# Patient Record
Sex: Female | Born: 1950 | Hispanic: Yes | Marital: Married
Health system: Southern US, Community
[De-identification: ages and names within clinical notes are randomized; demographics above are authoritative.]

## PROBLEM LIST (undated history)

## (undated) DIAGNOSIS — H409 Unspecified glaucoma: Secondary | ICD-10-CM

## (undated) HISTORY — PX: ABDOMINAL HYSTERECTOMY: SHX81

## (undated) HISTORY — PX: CHOLECYSTECTOMY: SHX55

## (undated) HISTORY — DX: Unspecified glaucoma: H40.9

---

## 2019-12-22 ENCOUNTER — Encounter: Payer: Self-pay | Admitting: Medical

## 2019-12-22 ENCOUNTER — Ambulatory Visit (INDEPENDENT_AMBULATORY_CARE_PROVIDER_SITE_OTHER): Payer: Medicare Other | Admitting: Medical

## 2019-12-22 ENCOUNTER — Ambulatory Visit (HOSPITAL_BASED_OUTPATIENT_CLINIC_OR_DEPARTMENT_OTHER)
Admission: RE | Admit: 2019-12-22 | Discharge: 2019-12-22 | Disposition: A | Discharge status: Home / Self Care | Payer: Medicare Other | Source: Ambulatory Visit | Attending: Medical | Admitting: Medical

## 2019-12-22 ENCOUNTER — Other Ambulatory Visit: Payer: Self-pay

## 2019-12-22 VITALS — BP 136/58 | HR 63 | Temp 97.7°F | Resp 18 | Ht 63.0 in | Wt 148.8 lb

## 2019-12-22 DIAGNOSIS — R748 Abnormal levels of other serum enzymes: Secondary | ICD-10-CM

## 2019-12-22 DIAGNOSIS — M5442 Lumbago with sciatica, left side: Secondary | ICD-10-CM

## 2019-12-22 DIAGNOSIS — E785 Hyperlipidemia, unspecified: Secondary | ICD-10-CM

## 2019-12-22 DIAGNOSIS — M5441 Lumbago with sciatica, right side: Secondary | ICD-10-CM | POA: Diagnosis present

## 2019-12-22 DIAGNOSIS — R1013 Epigastric pain: Secondary | ICD-10-CM

## 2019-12-22 LAB — CBC WITH DIFFERENTIAL/PLATELET
Basophils Absolute: 0 10*3/uL (ref 0.0–0.1)
Basophils Relative: 0.6 % (ref 0.0–3.0)
Eosinophils Absolute: 0.1 10*3/uL (ref 0.0–0.7)
Eosinophils Relative: 2.6 % (ref 0.0–5.0)
HCT: 39.5 % (ref 36.0–46.0)
Hemoglobin: 13.4 g/dL (ref 12.0–15.0)
Lymphocytes Relative: 45.6 % (ref 12.0–46.0)
Lymphs Abs: 1.9 10*3/uL (ref 0.7–4.0)
MCHC: 33.9 g/dL (ref 30.0–36.0)
MCV: 95 fl (ref 78.0–100.0)
Monocytes Absolute: 0.3 10*3/uL (ref 0.1–1.0)
Monocytes Relative: 7.2 % (ref 3.0–12.0)
Neutro Abs: 1.8 10*3/uL (ref 1.4–7.7)
Neutrophils Relative %: 44 % (ref 43.0–77.0)
Platelets: 230 10*3/uL (ref 150.0–400.0)
RBC: 4.16 Mil/uL (ref 3.87–5.11)
RDW: 13.5 % (ref 11.5–15.5)
WBC: 4.1 10*3/uL (ref 4.0–10.5)

## 2019-12-22 LAB — COMPREHENSIVE METABOLIC PANEL
ALT: 11 U/L (ref 0–35)
AST: 17 U/L (ref 0–37)
Albumin: 4.2 g/dL (ref 3.5–5.2)
Alkaline Phosphatase: 109 U/L (ref 39–117)
BUN: 15 mg/dL (ref 6–23)
CO2: 28 mEq/L (ref 19–32)
Calcium: 9.4 mg/dL (ref 8.4–10.5)
Chloride: 107 mEq/L (ref 96–112)
Creatinine, Ser: 0.64 mg/dL (ref 0.40–1.20)
GFR: 91.47 mL/min (ref >60.00)
Glucose, Bld: 91 mg/dL (ref 70–99)
Potassium: 5 mEq/L (ref 3.5–5.1)
Sodium: 142 mEq/L (ref 135–145)
Total Bilirubin: 0.5 mg/dL (ref 0.2–1.2)
Total Protein: 6.8 g/dL (ref 6.0–8.3)

## 2019-12-22 LAB — LIPID PANEL
Cholesterol: 205 mg/dL — ABNORMAL HIGH (ref 0–200)
HDL: 81.4 mg/dL (ref >39.00)
LDL Cholesterol: 104 mg/dL — ABNORMAL HIGH (ref 0–99)
NonHDL: 123.66
Total CHOL/HDL Ratio: 3
Triglycerides: 97 mg/dL (ref 0.0–149.0)
VLDL: 19.4 mg/dL (ref 0.0–40.0)

## 2019-12-22 LAB — LIPASE: Lipase: 30 U/L (ref 11.0–59.0)

## 2019-12-22 IMAGING — DX DG LUMBAR SPINE 2-3V
3 series · 3 of 3 positions shown · non-contrast
Comparison: None

CLINICAL DATA: Low back pain and bilateral sciatica.

EXAM:
LUMBAR SPINE - 2-3 VIEW

[l-spine ap]
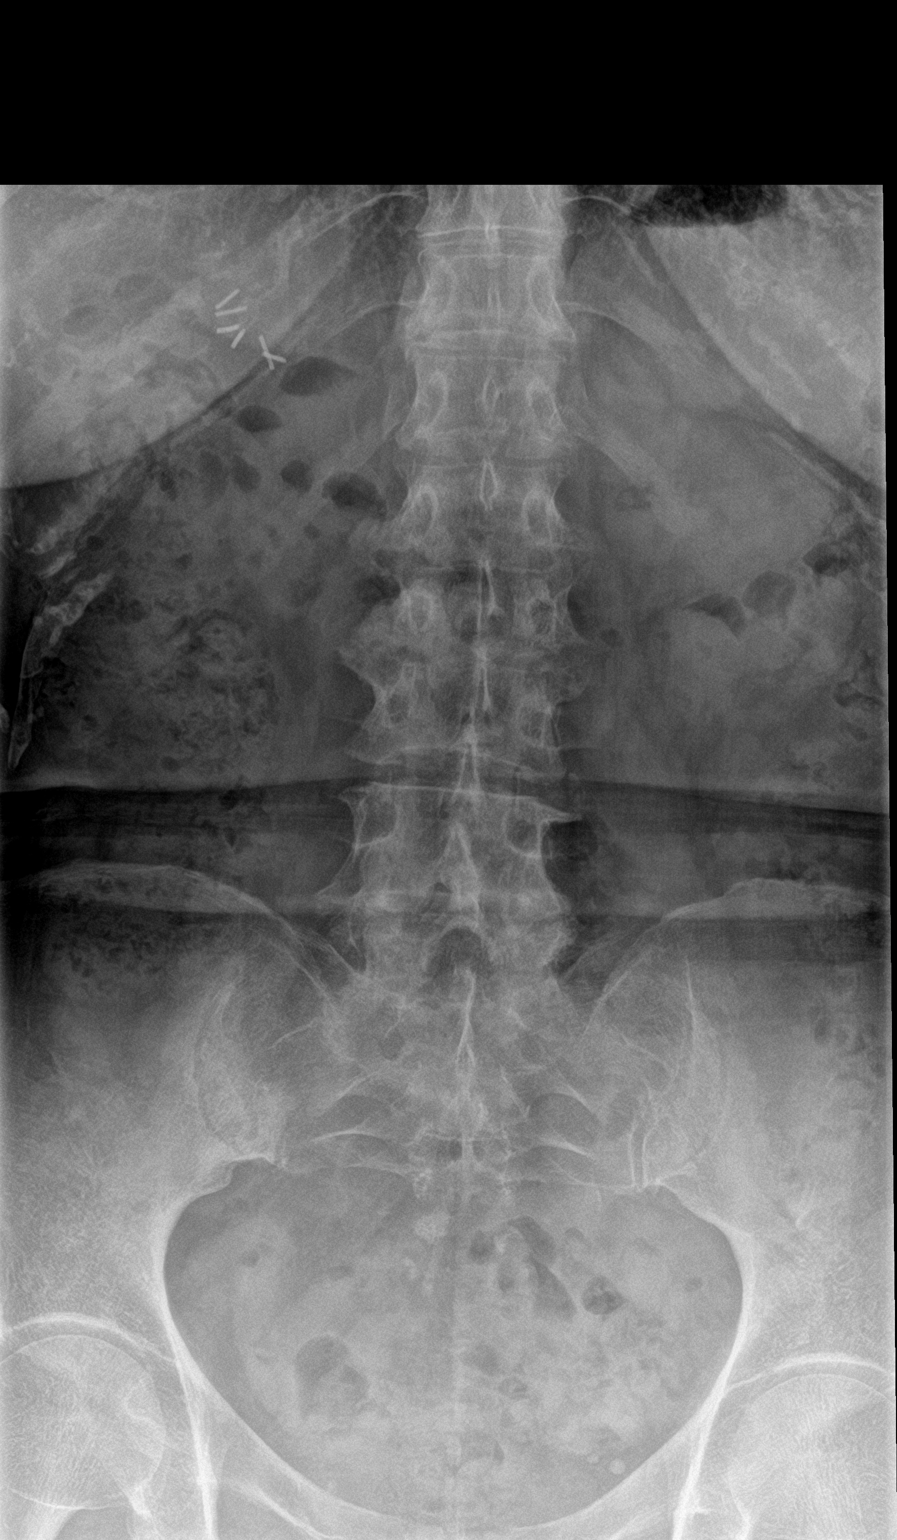

[l-spine lat]
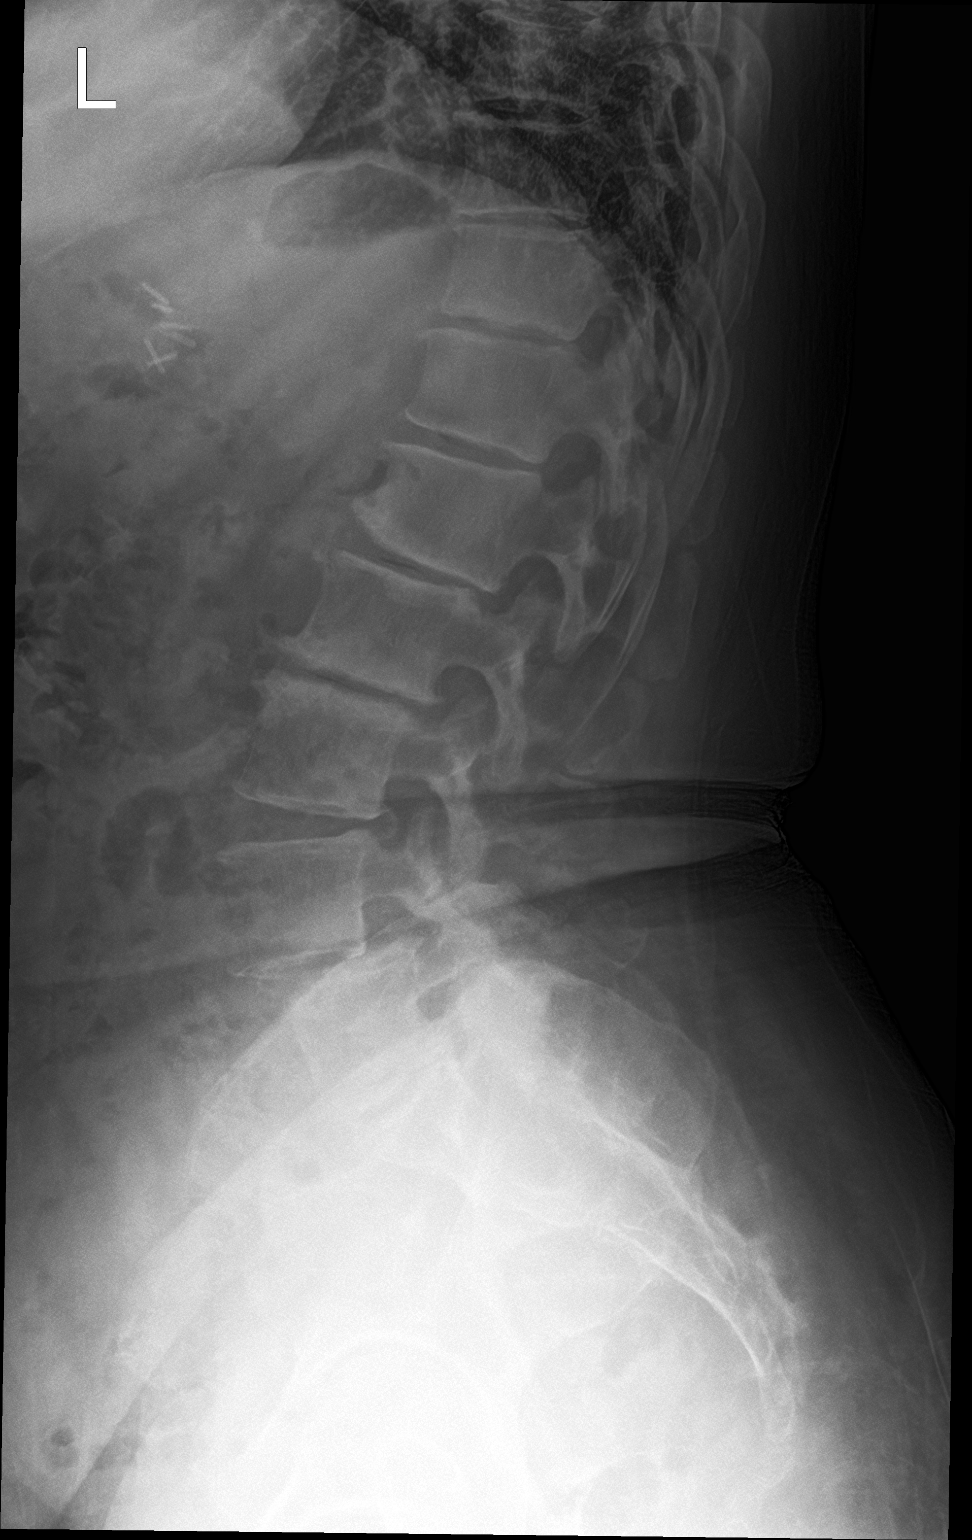

[l-spine spot]
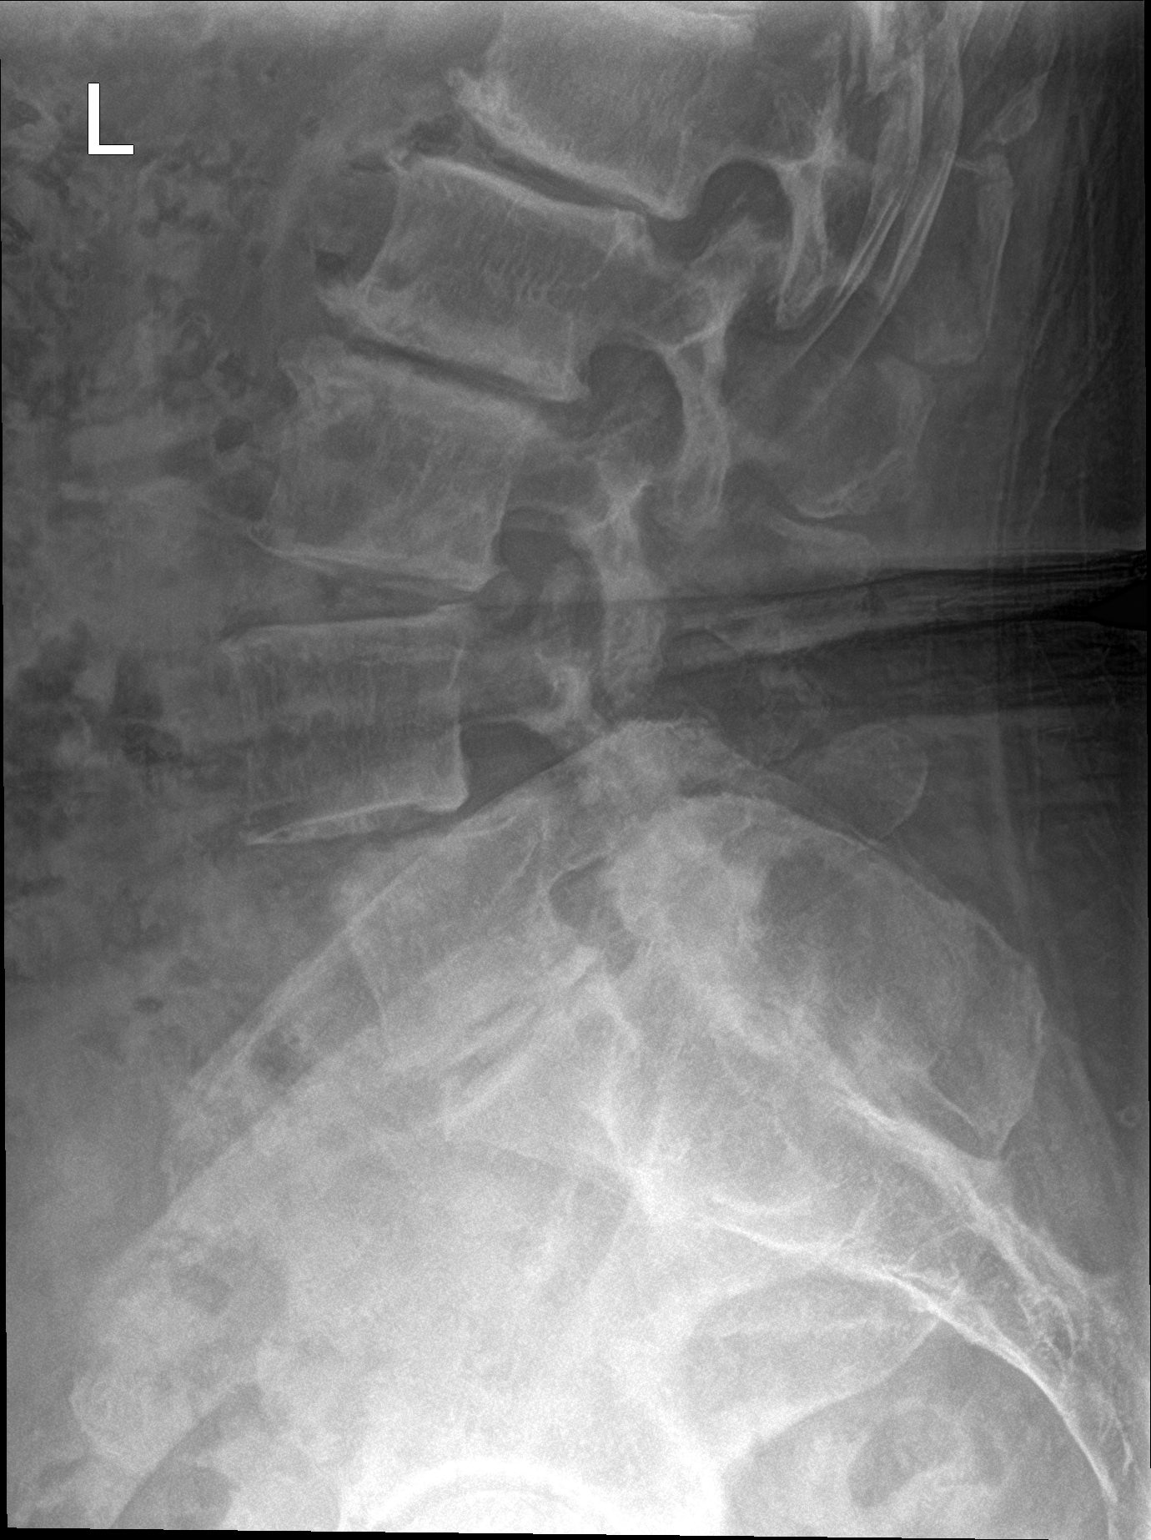

[3 of 3 positions shown; findings below may reference images not displayed]

FINDINGS: There is a first degree anterolisthesis of L4 on L5. Mild anterior
wedging of the L1 vertebral body. The remaining lumbar vertebral
body heights are well preserved. Multi level disc space narrowing
and endplate spurring is noted throughout the lumbar spine. This is
most advanced at L2-3 and L5-S1. Cholecystectomy clips noted in the
right upper quadrant of the abdomen.
IMPRESSION: 1. Multi level degenerative disc disease with first degree
anterolisthesis of L4 on L5.
2. Mild chronic appearing anterior wedging of the L1 vertebral body.

## 2019-12-22 NOTE — Progress Notes (Addendum)
Subjective:    Patient ID: Sara [name], female    DOB: Feb 24, 1951, 69 y.o.   MRN: [id number]  HPI  Pt in for for first time.  Pt is from [place] originally. Pt moved from [place] 3 months ago. Temporarily living in [place] and will move to house in [place].   Pt married.  She does not exercise much. She states eating healthy. Drinks one cup of coffee a day. No sodas. Non smoker.  Pt states no meds used in past.  She states only mild lipid elevation in the past. Slight liver enzyme elevation in the past. Pt drinks alcohol very rarely and minimal.   Mentioned about one week of epigastric pain after easter. Felt full easily without eating and pain after eating. Maybe reflux. Had sour taste in mouth. Pain can occur maybe one time a week and very transient and mild.  Pt had EGD around time had gallbladder removed.  Pt last colonoscopy was 2015. Had done out of states. She states 2 in her life and told normal studies.   Only 2 lb wt loss as she is trying to loose weight.   Recent lost 3 family members from Covid. She is said about this and could not visit her family. Pt is depressed. Pt mood has been improving slowly.       Review of Systems  Constitutional: Negative for chills, fatigue and fever.  HENT: Negative for congestion and ear discharge.   Respiratory: Negative for cough, choking, chest tightness, shortness of breath and wheezing.   Cardiovascular: Negative for chest pain and palpitations.  Gastrointestinal: Negative for abdominal pain, constipation, nausea and vomiting.  Musculoskeletal: Positive for back pain.       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 Pain low back for one month. Some piain that radiates to both legs. No meds used.  Skin: Negative for rash.  Neurological: Negative for dizziness, numbness and headaches.  Hematological: Negative for adenopathy. Does not  bruise/bleed easily.  Psychiatric/Behavioral: Negative for behavioral problems, confusion and sleep disturbance. The patient is not nervous/anxious.    No past medical history on file.   Social History   Socioeconomic History  . Marital status: Married    Spouse name: Not on file  . Number of children: Not on file  . Years of education: Not on file  . Highest education level: Not on file  Occupational History  . Not on file  Tobacco Use  . Smoking status: Not on file  Substance and Sexual Activity  . Alcohol use: Not on file  . Drug use: Not on file  . Sexual activity: Not on file  Other Topics Concern  . Not on file  Social History Narrative  . Not on file   Social Determinants of Health   Financial Resource Strain:   . Difficulty of Paying Living Expenses:   Food Insecurity:   . Worried About Programme researcher, broadcasting/film/video in the Last Year:   . Barista in the Last Year:   Transportation Needs:   . Freight forwarder (Medical):   Marland Kitchen Lack of Transportation (Non-Medical):   Physical Activity:   . Days of Exercise per Week:   . Minutes of Exercise per Session:   Stress:   . Feeling of Stress :   Social Connections:   . Frequency of Communication with Friends and Family:   . Frequency of Social  Gatherings with Friends and Family:   . Attends Religious Services:   . Active Member of Clubs or Organizations:   . Attends Banker Meetings:   Marland Kitchen Marital Status:   Intimate Partner Violence:   . Fear of Current or Ex-Partner:   . Emotionally Abused:   Marland Kitchen Physically Abused:   . Sexually Abused:      No family history on file.  Not on File  No current outpatient medications on file prior to visit.   No current facility-administered medications on file prior to visit.    BP (!) 136/58 (BP Location: Left Arm, Patient Position: Sitting, Cuff Size: Normal)   Pulse 63   Temp 97.7 F (36.5 C) (Temporal)   Resp 18   Ht 5\' 3"  (1.6 m)   Wt 148 lb 12.8 oz  (67.5 kg)   SpO2 98%   BMI 26.36 kg/m       Objective:   Physical Exam  General Mental Status- Alert. General Appearance- Not in acute distress.   Skin General: Color- Normal Color. Moisture- Normal Moisture.  Neck Carotid Arteries- Normal color. Moisture- Normal Moisture. No carotid bruits. No JVD.  Chest and Lung Exam Auscultation: Breath Sounds:-Normal.  Cardiovascular Auscultation:Rythm- Regular. Murmurs & Other Heart Sounds:Auscultation of the heart reveals- No Murmurs.  Abdomen Inspection:-Inspeection Normal. Palpation/Percussion:Note:No mass. Palpation and Percussion of the abdomen reveal- Non Tender, Non Distended + BS, no rebound or guarding.  Neurologic Cranial Nerve exam:- CN III-XII intact(No nystagmus), symmetric smile. Strength:- 5/5 equal and symmetric strength both upper and lower extremities.  Back-  Bilateral sciatic pain on palpation. Pain rotating thorax.   Lower ext- l5-s1 sensation intact. Strength symmetric 5/5.    Assessment & Plan:  For high cholesterol and elevated liver enzyme history will get lipid panel and cmp today.  For hx of epigastric pain will get cbc, lipase and h pylori breath test. Eat healthy and use pepcid if you need.  For back pain, will get xray today. Can use low dose ibuprofen 200 mg every 8 hours. Titrate up if needed. Rx advisement.   Might refer to sports medicine after xray review.  Please sign release of information form.  Follow up 1 month or as needed  Esperanza Richters, PA-C   Time spent with patient today was 30  minutes which consisted of  discussing diagnosis, work up ,treatment and documentation.  Ask to sign release form so we can get old records.

## 2019-12-22 NOTE — Patient Instructions (Signed)
For high cholesterol and elevated liver enzyme history will get lipid panel and cmp today.  For hx of epigastric pain will get cbc, lipase and h pylori breath test. Eat healthy and use pepcid if you need.  For back pain, will get xray today. Can use low dose ibuprofen 200 mg every 8 hours. Titrate up if needed. Rx advisement.   Might refer to sports medicine after xray review.  Please sign release of information form.  Follow up 1 month or as needed

## 2019-12-25 LAB — H. PYLORI BREATH TEST: H. pylori Breath Test: NOT DETECTED

## 2019-12-29 ENCOUNTER — Telehealth: Payer: Self-pay | Admitting: Medical

## 2019-12-29 DIAGNOSIS — M5441 Lumbago with sciatica, right side: Secondary | ICD-10-CM

## 2019-12-29 NOTE — Telephone Encounter (Signed)
Referral to ortho placed.

## 2020-02-12 ENCOUNTER — Other Ambulatory Visit: Payer: Self-pay | Admitting: Obstetrics and Gynecology

## 2020-02-12 DIAGNOSIS — Z1231 Encounter for screening mammogram for malignant neoplasm of breast: Secondary | ICD-10-CM

## 2020-02-13 ENCOUNTER — Ambulatory Visit (INDEPENDENT_AMBULATORY_CARE_PROVIDER_SITE_OTHER): Payer: Medicare Other | Admitting: Medical

## 2020-02-13 ENCOUNTER — Other Ambulatory Visit: Payer: Self-pay

## 2020-02-13 VITALS — BP 110/60 | HR 63 | Temp 98.5°F | Resp 16 | Ht 63.0 in | Wt 146.0 lb

## 2020-02-13 DIAGNOSIS — E785 Hyperlipidemia, unspecified: Secondary | ICD-10-CM | POA: Diagnosis not present

## 2020-02-13 NOTE — Progress Notes (Signed)
Subjective:    Patient ID: Sara [name], female    DOB: 1951/06/12, 69 y.o.   MRN: [id number]  HPI  Pt in for follow up.  Pt has mild elevated cholesterol. Pt has negative family history for stroke or MI. Including siblings. Pt never was smoker. Pt has started fish oil. Pt wants to avoid taking medications prescription.  Pt states her stomach is better but not completely. Pt last labs came back negative. Pt h pylori came back negative. Pt lipase was normal. No weight loss. Now pain is much less frequent and less intense. Notes sometimes with eating.     Review of Systems  Constitutional: Negative for chills, fatigue and fever.  Respiratory: Negative for cough, chest tightness, shortness of breath and wheezing.   Cardiovascular: Negative for chest pain and palpitations.  Gastrointestinal: Negative for abdominal pain, diarrhea, nausea and vomiting.  Genitourinary: Negative for difficulty urinating, dysuria, flank pain and frequency.  Musculoskeletal: Negative for back pain.  Skin: Negative for rash.  Neurological: Negative for dizziness, seizures, weakness, numbness and headaches.  Hematological: Negative for adenopathy. Does not bruise/bleed easily.  Psychiatric/Behavioral: Positive for dysphoric mood. Negative for behavioral problems, confusion, sleep disturbance and suicidal ideas. The patient is not nervous/anxious.    No past medical history on file.   Social History   Socioeconomic History  . Marital status: Married    Spouse name: Not on file  . Number of children: Not on file  . Years of education: Not on file  . Highest education level: Not on file  Occupational History  . Not on file  Tobacco Use  . Smoking status: Never Smoker  . Smokeless tobacco: Never Used  Substance and Sexual Activity  . Alcohol use: Yes    Comment: rare  . Drug use: Never  . Sexual activity: Not on file  Other Topics Concern  . Not on file  Social History Narrative  . Not  on file   Social Determinants of Health   Financial Resource Strain:   . Difficulty of Paying Living Expenses:   Food Insecurity:   . Worried About Programme researcher, broadcasting/film/video in the Last Year:   . Barista in the Last Year:   Transportation Needs:   . Freight forwarder (Medical):   Marland Kitchen Lack of Transportation (Non-Medical):   Physical Activity:   . Days of Exercise per Week:   . Minutes of Exercise per Session:   Stress:   . Feeling of Stress :   Social Connections:   . Frequency of Communication with Friends and Family:   . Frequency of Social Gatherings with Friends and Family:   . Attends Religious Services:   . Active Member of Clubs or Organizations:   . Attends Banker Meetings:   Marland Kitchen Marital Status:   Intimate Partner Violence:   . Fear of Current or Ex-Partner:   . Emotionally Abused:   Marland Kitchen Physically Abused:   . Sexually Abused:     Past Surgical History:  Procedure Laterality Date  . ABDOMINAL HYSTERECTOMY    . CHOLECYSTECTOMY      No family history on file.  Not on File  No current outpatient medications on file prior to visit.   No current facility-administered medications on file prior to visit.    BP 110/60 (BP Location: Left Arm, Patient Position: Sitting, Cuff Size: Normal)   Pulse 63   Temp 98.5 F (36.9 C) (Oral)   Resp 16  Ht 5\' 3"  (1.6 m)   Wt 146 lb (66.2 kg)   SpO2 97%   BMI 25.86 kg/m       Objective:   Physical Exam  General- No acute distress. Pleasant patient. Neck- Full range of motion, no jvd Lungs- Clear, even and unlabored. Heart- regular rate and rhythm. Neurologic- CNII- XII grossly intact.       Assessment & Plan:  For high cholesterol, continue with low cholesterol diet and fish oil. Will put in future cmp and lipid panel to do end of September.  For intermittent mild transient  upset stomach can use famotadine on occasion. Concentrate on healthy diet. If pain worsens then can expand work up and  refer to GI.  For depression that is improving want to offer medication such as effexor or other if you feel not getting over family deaths or if mood worsens. Let us know if you feel you need.  Follow up after future lab review or as needed  Time spent with patient today was 30  minutes which consisted of chart review, discussing diagnosis, work up, treatment and documentation.  Esperanza Richters, PA-C

## 2020-02-13 NOTE — Patient Instructions (Addendum)
For high cholesterol, continue with low cholesterol diet and fish oil. Will put in future cmp and lipid panel to do end of September.  For intermittent mild transient  upset stomach can use famotadine/pepcid over the counter  on occasion. Concentrate on healthy diet. If pain worsens then can expand work up and refer to GI.  For depression that is improving want to offer medication such as effexor or other if you feel not getting over family deaths or if mood worsens. Let us know if you feel you need.  Follow up after future lab review or as needed

## 2020-02-21 ENCOUNTER — Other Ambulatory Visit: Payer: Self-pay

## 2020-02-21 ENCOUNTER — Other Ambulatory Visit (INDEPENDENT_AMBULATORY_CARE_PROVIDER_SITE_OTHER): Payer: Medicare Other

## 2020-02-21 DIAGNOSIS — E785 Hyperlipidemia, unspecified: Secondary | ICD-10-CM

## 2020-02-21 LAB — COMPREHENSIVE METABOLIC PANEL
AG Ratio: 1.5 (calc) (ref 1.0–2.5)
ALT: 12 U/L (ref 6–29)
AST: 17 U/L (ref 10–35)
Albumin: 4 g/dL (ref 3.6–5.1)
Alkaline phosphatase (APISO): 105 U/L (ref 37–153)
BUN: 10 mg/dL (ref 7–25)
CO2: 29 mmol/L (ref 20–32)
Calcium: 9.4 mg/dL (ref 8.6–10.4)
Chloride: 108 mmol/L (ref 98–110)
Creat: 0.71 mg/dL (ref 0.60–0.93)
Globulin: 2.7 g/dL (calc) (ref 1.9–3.7)
Glucose, Bld: 93 mg/dL (ref 65–99)
Potassium: 5.1 mmol/L (ref 3.5–5.3)
Sodium: 142 mmol/L (ref 135–146)
Total Bilirubin: 0.4 mg/dL (ref 0.2–1.2)
Total Protein: 6.7 g/dL (ref 6.1–8.1)

## 2020-02-21 LAB — LIPID PANEL
Cholesterol: 190 mg/dL (ref <200)
HDL: 85 mg/dL (ref >=50)
LDL Cholesterol (Calc): 84 mg/dL (calc)
Non-HDL Cholesterol (Calc): 105 mg/dL (calc) (ref <130)
Total CHOL/HDL Ratio: 2.2 (calc) (ref <5.0)
Triglycerides: 112 mg/dL (ref <150)

## 2020-02-21 NOTE — Addendum Note (Signed)
Addended by: Mervin Kung A on: 02/21/2020 08:45 AM   Modules accepted: Orders

## 2020-02-27 ENCOUNTER — Ambulatory Visit: Payer: Medicare Other

## 2020-03-07 ENCOUNTER — Other Ambulatory Visit: Payer: Medicare Other

## 2020-11-13 ENCOUNTER — Encounter: Payer: Medicare Other | Admitting: Medical

## 2020-11-13 ENCOUNTER — Other Ambulatory Visit: Payer: Self-pay

## 2020-11-15 ENCOUNTER — Emergency Department (HOSPITAL_COMMUNITY): Payer: Medicare Other

## 2020-11-15 ENCOUNTER — Encounter (HOSPITAL_COMMUNITY): Payer: Self-pay

## 2020-11-15 ENCOUNTER — Other Ambulatory Visit: Payer: Self-pay

## 2020-11-15 ENCOUNTER — Emergency Department (HOSPITAL_COMMUNITY)
Admission: EM | Admit: 2020-11-15 | Discharge: 2020-11-15 | Disposition: A | Discharge status: Home / Self Care | Payer: Medicare Other | Attending: Emergency Medicine | Admitting: Emergency Medicine

## 2020-11-15 DIAGNOSIS — R030 Elevated blood-pressure reading, without diagnosis of hypertension: Secondary | ICD-10-CM | POA: Insufficient documentation

## 2020-11-15 DIAGNOSIS — R519 Headache, unspecified: Secondary | ICD-10-CM | POA: Insufficient documentation

## 2020-11-15 LAB — COMPREHENSIVE METABOLIC PANEL
ALT: 16 U/L (ref 0–44)
AST: 21 U/L (ref 15–41)
Albumin: 3.9 g/dL (ref 3.5–5.0)
Alkaline Phosphatase: 115 U/L (ref 38–126)
Anion gap: 7 (ref 5–15)
BUN: 10 mg/dL (ref 8–23)
CO2: 25 mmol/L (ref 22–32)
Calcium: 9.2 mg/dL (ref 8.9–10.3)
Chloride: 108 mmol/L (ref 98–111)
Creatinine, Ser: 0.64 mg/dL (ref 0.44–1.00)
GFR, Estimated: >60 mL/min (ref >60)
Glucose, Bld: 103 mg/dL — ABNORMAL HIGH (ref 70–99)
Potassium: 4.1 mmol/L (ref 3.5–5.1)
Sodium: 140 mmol/L (ref 135–145)
Total Bilirubin: 0.7 mg/dL (ref 0.3–1.2)
Total Protein: 7.5 g/dL (ref 6.5–8.1)

## 2020-11-15 LAB — CBC WITH DIFFERENTIAL/PLATELET
Abs Immature Granulocytes: 0.01 10*3/uL (ref 0.00–0.07)
Basophils Absolute: 0 10*3/uL (ref 0.0–0.1)
Basophils Relative: 0 %
Eosinophils Absolute: 0.1 10*3/uL (ref 0.0–0.5)
Eosinophils Relative: 2 %
HCT: 39 % (ref 36.0–46.0)
Hemoglobin: 13.1 g/dL (ref 12.0–15.0)
Immature Granulocytes: 0 %
Lymphocytes Relative: 51 %
Lymphs Abs: 2.5 10*3/uL (ref 0.7–4.0)
MCH: 32 pg (ref 26.0–34.0)
MCHC: 33.6 g/dL (ref 30.0–36.0)
MCV: 95.4 fL (ref 80.0–100.0)
Monocytes Absolute: 0.3 10*3/uL (ref 0.1–1.0)
Monocytes Relative: 6 %
Neutro Abs: 2.1 10*3/uL (ref 1.7–7.7)
Neutrophils Relative %: 41 %
Platelets: 223 10*3/uL (ref 150–400)
RBC: 4.09 MIL/uL (ref 3.87–5.11)
RDW: 12.8 % (ref 11.5–15.5)
WBC: 5 10*3/uL (ref 4.0–10.5)
nRBC: 0 % (ref 0.0–0.2)

## 2020-11-15 LAB — URINALYSIS, ROUTINE W REFLEX MICROSCOPIC
Bilirubin Urine: NEGATIVE
Glucose, UA: NEGATIVE mg/dL
Hgb urine dipstick: NEGATIVE
Ketones, ur: NEGATIVE mg/dL
Leukocytes,Ua: NEGATIVE
Nitrite: NEGATIVE
Protein, ur: NEGATIVE mg/dL
Specific Gravity, Urine: 1.008 (ref 1.005–1.030)
pH: 8 (ref 5.0–8.0)

## 2020-11-15 IMAGING — MR MR HEAD WO/W CM
9 of 15 series · 20 of 48 positions shown · IV contrast (gadavist)
Comparison: No pertinent prior exam.

CLINICAL DATA: Headache

EXAM:
MRI HEAD WITHOUT AND WITH CONTRAST
MRA HEAD WITHOUT CONTRAST
TECHNIQUE: Multiplanar, multi-echo pulse sequences of the brain and surrounding
structures were acquired without and with intravenous contrast.
Angiographic images of the Circle of Willis were acquired using MRA
technique without intravenous contrast.
CONTRAST:  7mL GADAVIST GADOBUTROL 1 MMOL/ML IV SOLN

[Series 2: DWI · axial · 3.0mm · 0.94mm/px · z∈[-98,+42]mm · 6 of 99 slices shown (1 of 2)]
[im 1/99]
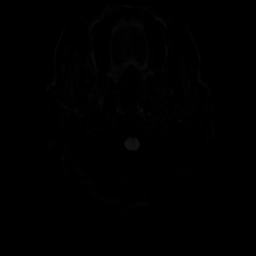
[im 20/99]
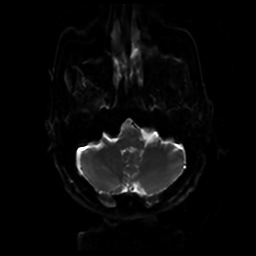
[im 40/99]
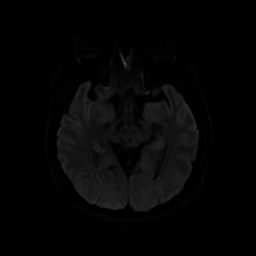
[im 59/99]
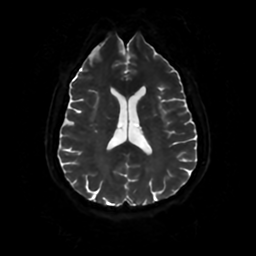
[im 79/99]
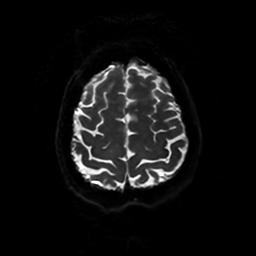
[im 99/99]
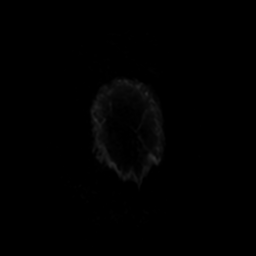

[Series 4: DWI · coronal · 4.0mm · 0.94mm/px · 4 of 74 slices shown (2 of 2)]
[im 1/74]
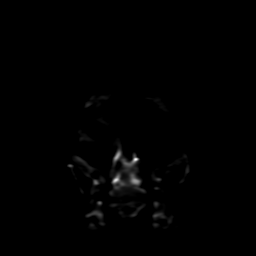
[im 25/74]
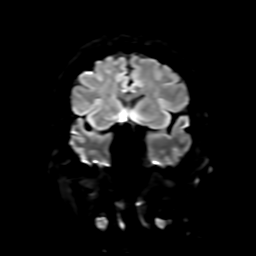
[im 49/74]
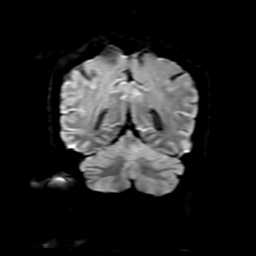
[im 74/74]
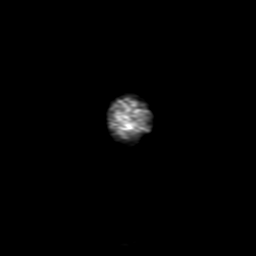

[Series 5: FLAIR · sagittal · 5.0mm · 0.23mm/px · 1 of 25 slices shown (1 of 3)]
[im 1/25]
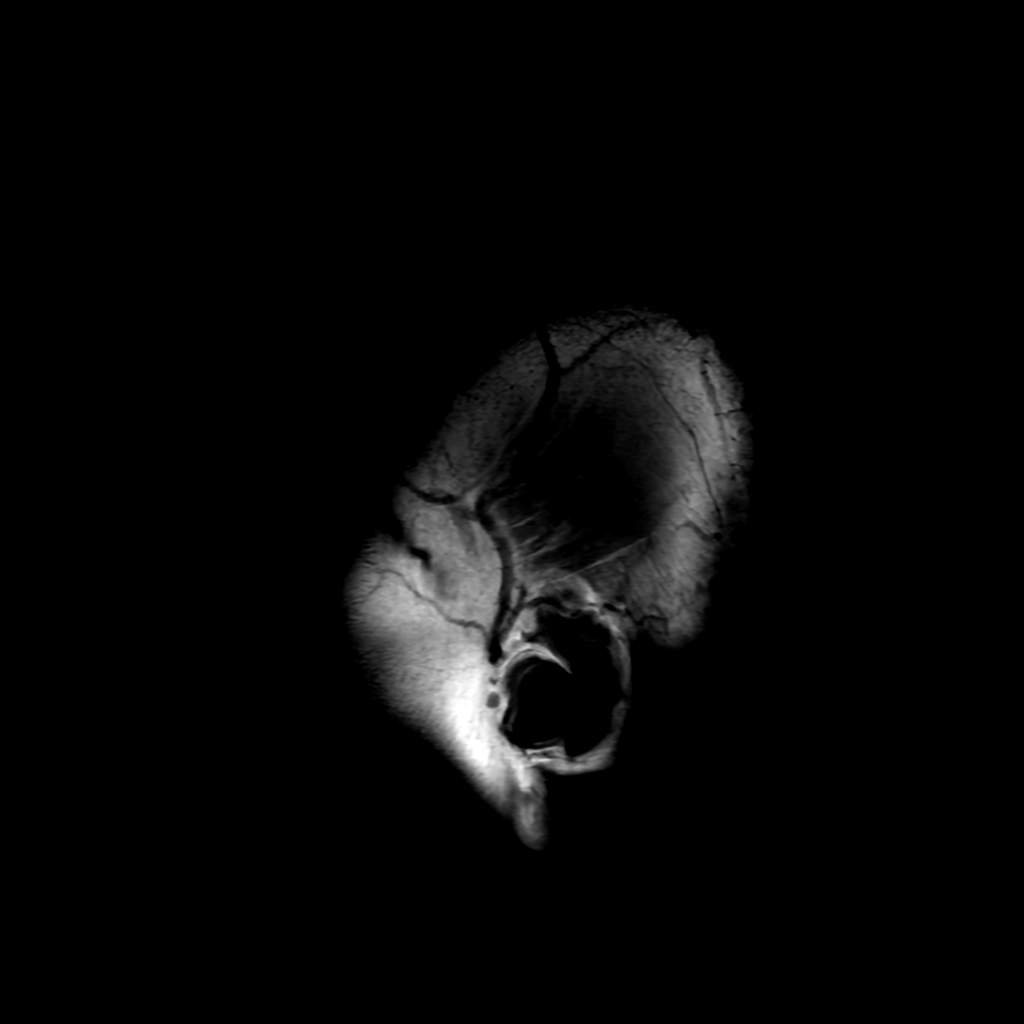

[Series 6: T2 · axial · 5.0mm · 0.23mm/px · 1 of 26 slices shown]
[im 1/26]
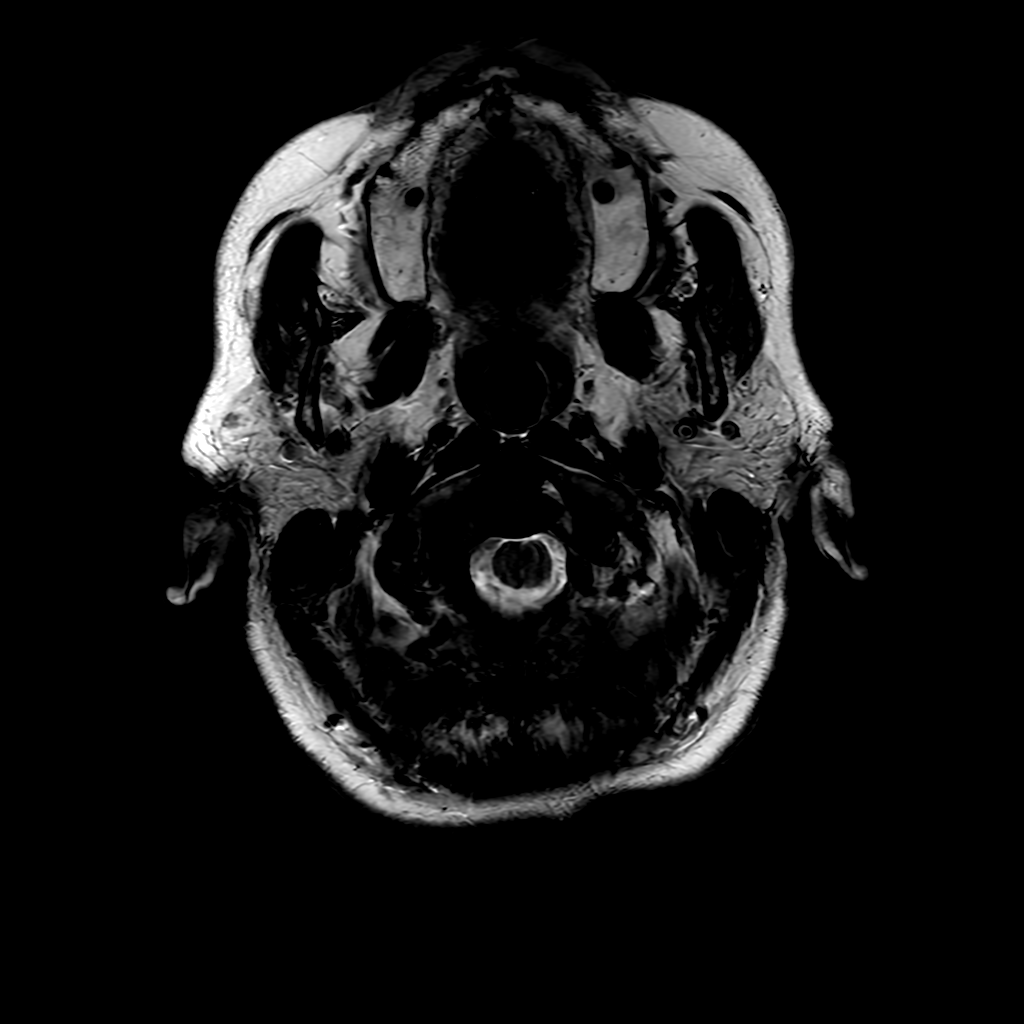

[Series 7: FLAIR · axial · 3.0mm · 0.45mm/px · 1 of 24 slices shown (2 of 3)]
[im 1/24]
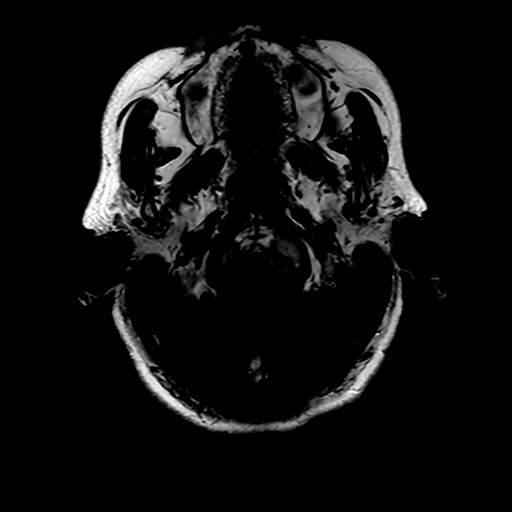

[Series 10: FLAIR · axial · 3.0mm · 0.45mm/px · 1 of 24 slices shown (3 of 3)]
[im 1/24]
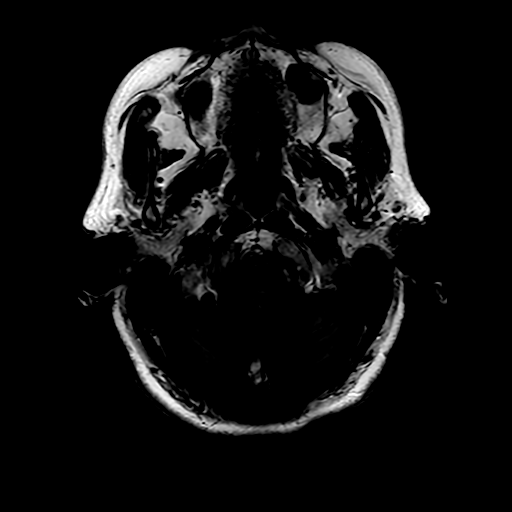

[Series 11: T2 post-contrast · coronal · 5.0mm · 0.20mm/px · 1 of 29 slices shown]
[im 1/29]
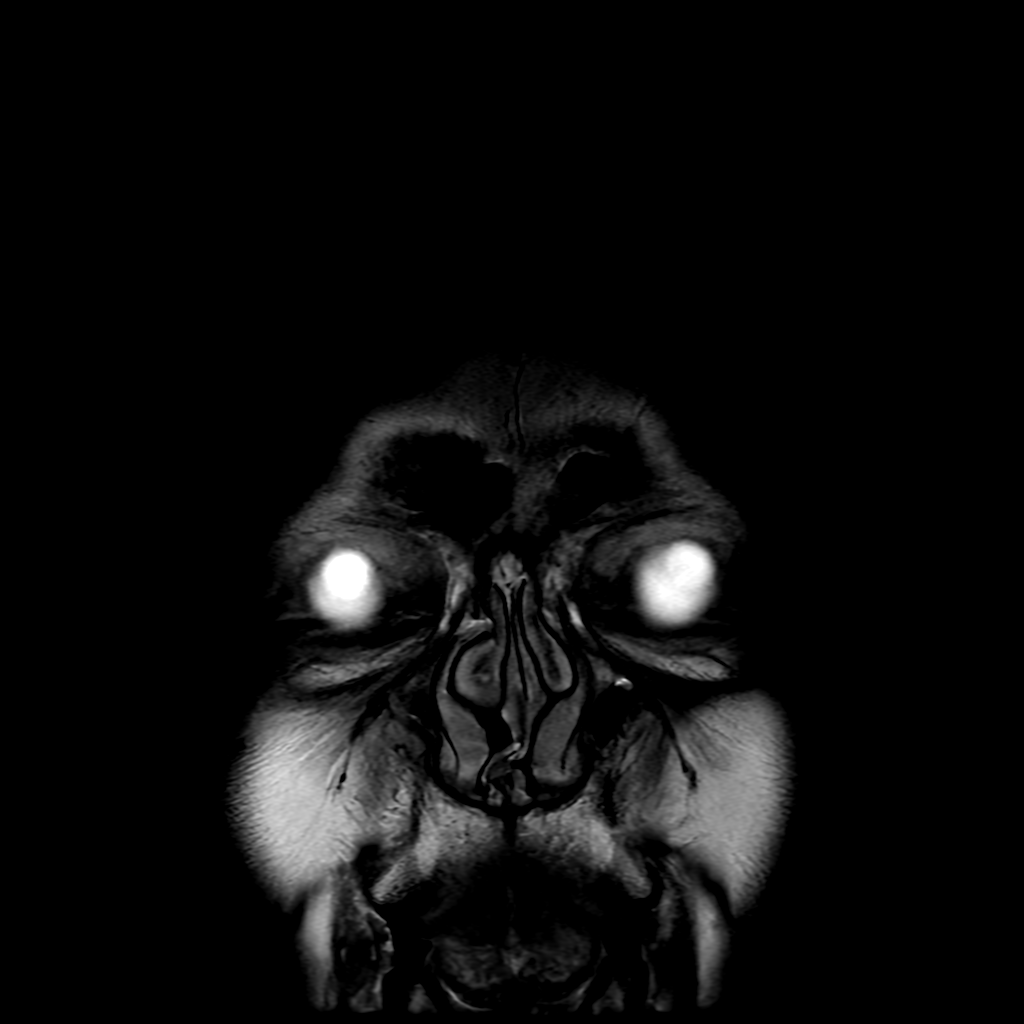

[Series 250: ADC · axial · 3.0mm · 0.94mm/px · z∈[-98,+42]mm · 3 of 50 slices shown (1 of 2)]
[im 1/50]
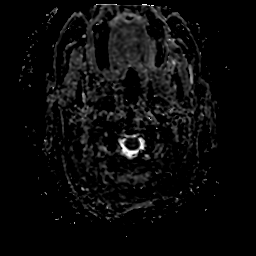
[im 25/50]
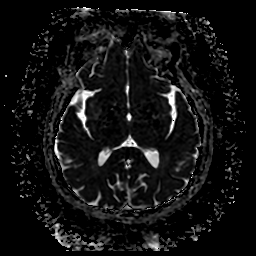
[im 50/50]
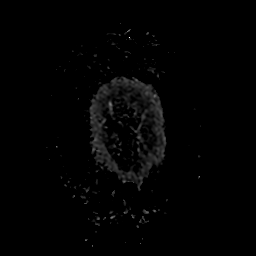

[Series 450: ADC · coronal · 4.0mm · 0.94mm/px · 2 of 37 slices shown (2 of 2)]
[im 1/37]
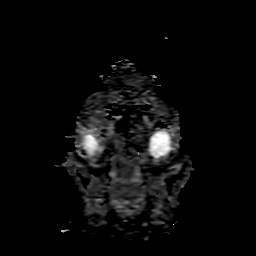
[im 37/37]
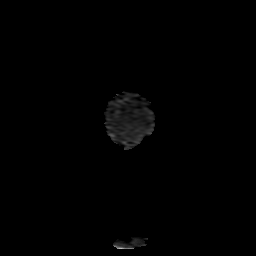

[20 of 48 positions shown; findings below may reference images not displayed]

FINDINGS: MRI HEAD FINDINGS

Brain: No acute infarct, mass effect or extra-axial collection.
Three scattered foci of chronic microhemorrhage. Normal white matter
signal, parenchymal volume and CSF spaces. The midline structures
are normal. There is no abnormal contrast enhancement.

Vascular: Major flow voids are preserved.

Skull and upper cervical spine: Normal calvarium and skull base.
Visualized upper cervical spine and soft tissues are normal.

Sinuses/Orbits:No paranasal sinus fluid levels or advanced mucosal
thickening. No mastoid or middle ear effusion. Normal orbits.

MRA HEAD FINDINGS

POSTERIOR CIRCULATION:

--Vertebral arteries: Normal

--Inferior cerebellar arteries: Normal.

--Basilar artery: Normal.

--Superior cerebellar arteries: Normal.

--Posterior cerebral arteries: Normal.

ANTERIOR CIRCULATION:

--Intracranial internal carotid arteries: Normal.

--Anterior cerebral arteries (ACA): Normal.

--Middle cerebral arteries (MCA): Normal.

ANATOMIC VARIANTS: None
IMPRESSION: 1. No acute intracranial abnormality.
2. Normal intracranial MRA.
3. Hyperdense area described on earlier head CT corresponds to the
normal right vertebral artery.

## 2020-11-15 IMAGING — CT CT HEAD W/O CM
3 series · 14 of 47 positions shown, 16 images · non-contrast
Comparison: None.

CLINICAL DATA: New or worsening headache in a 69-year-old female.

EXAM:
CT HEAD WITHOUT CONTRAST
TECHNIQUE: Contiguous axial images were obtained from the base of the skull
through the vertex without intravenous contrast.

[Series 3: head 5.0 h30s · axial · 0.41mm/px · z∈[+361,+501]mm · 8 of 34 slices shown, 10 images]
[im 3/34  brain]
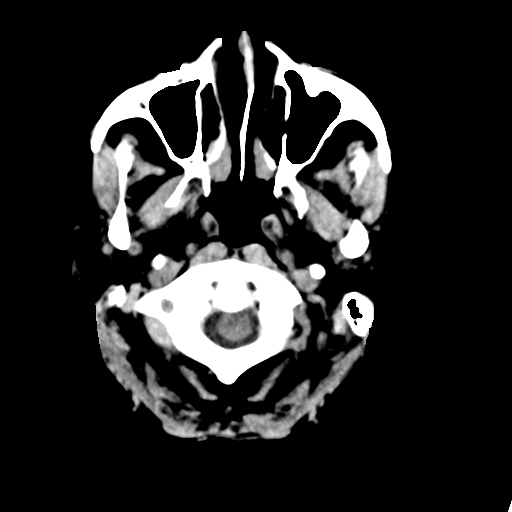
[im 3/34  bone]
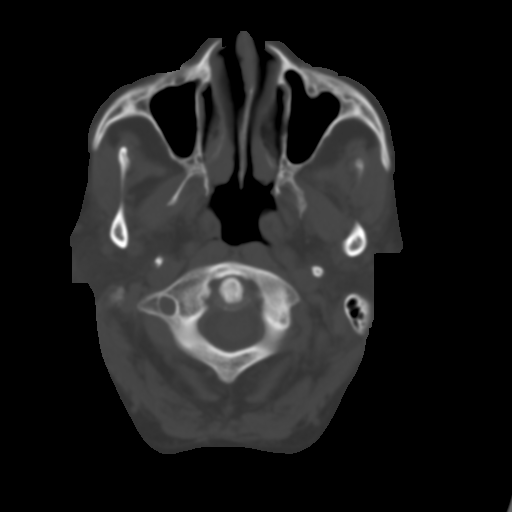
[im 7/34  brain]
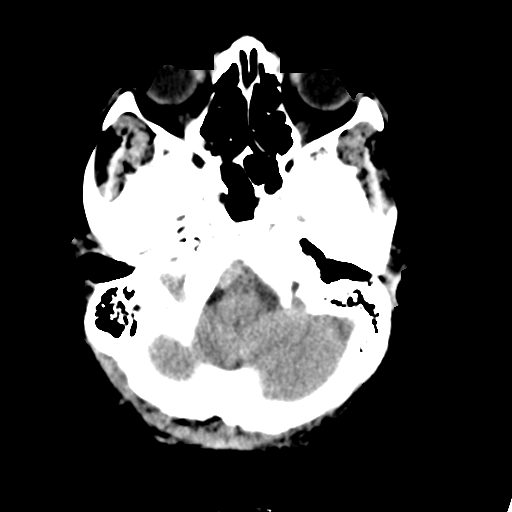
[im 11/34  brain]
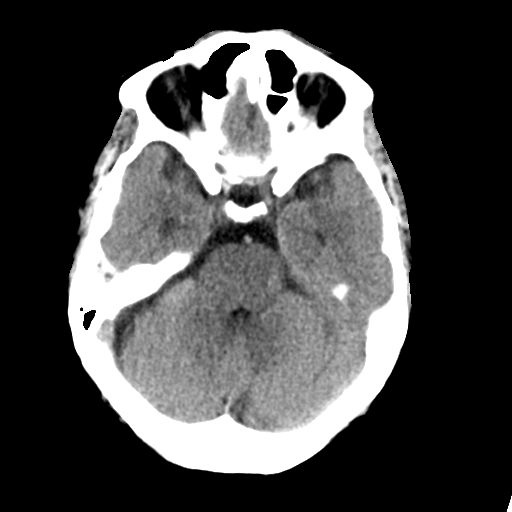
[im 15/34  brain]
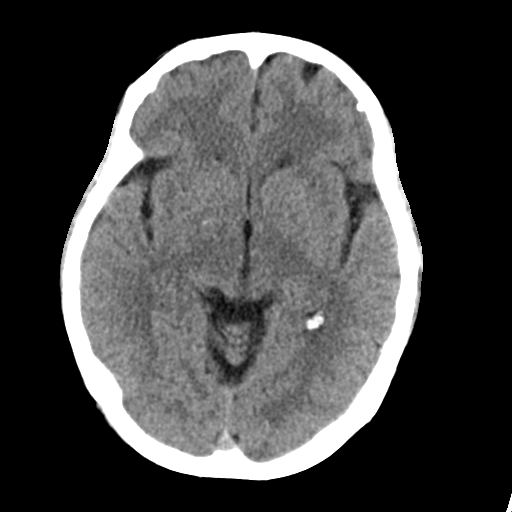
[im 19/34  brain]
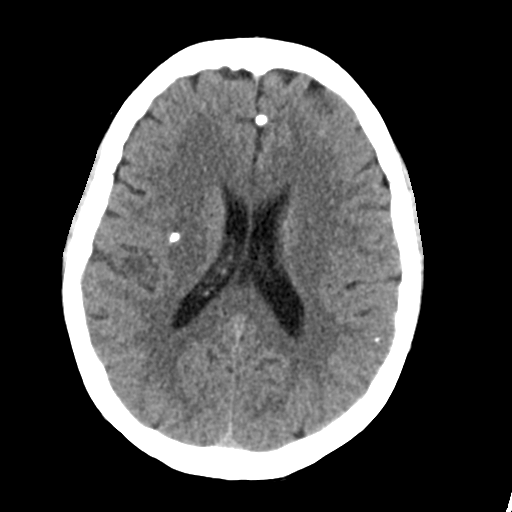
[im 19/34  bone]
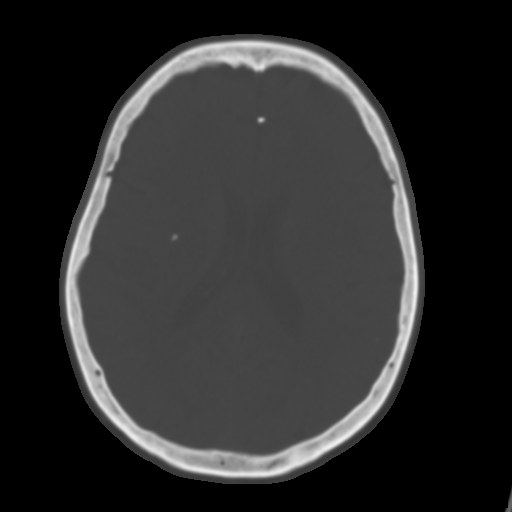
[im 23/34  brain]
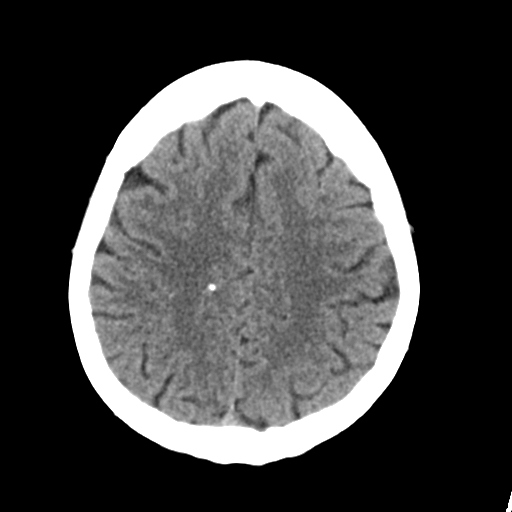
[im 27/34  brain]
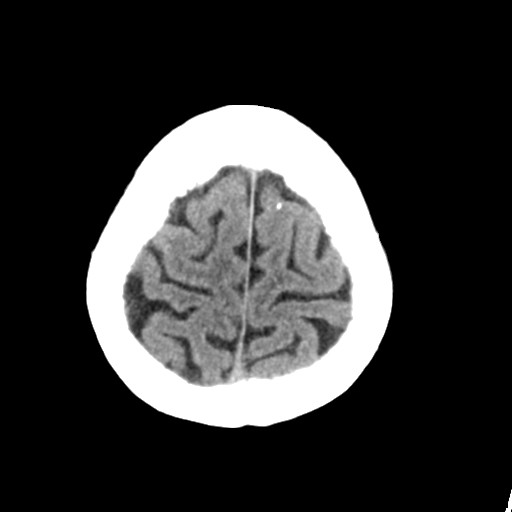
[im 31/34  brain]
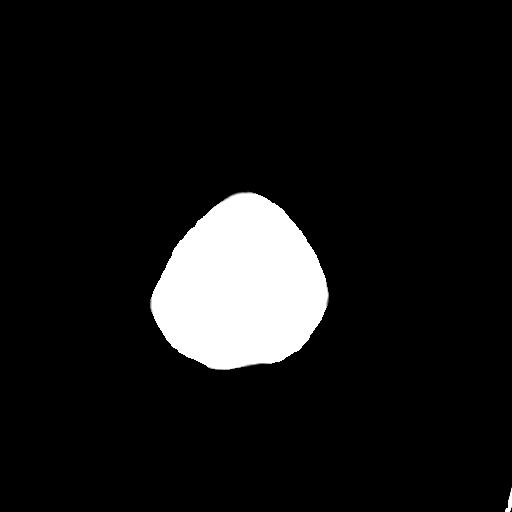

[Series 5: head 3.0 mpr cor · coronal · 0.33mm/px · 3 of 67 slices shown]
[im 23/67  brain]
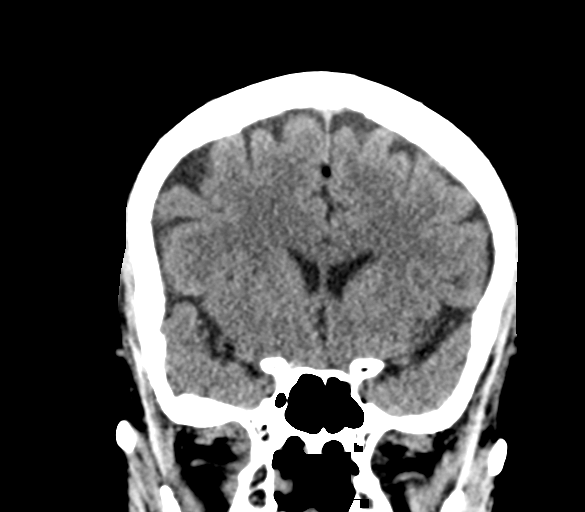
[im 30/67  brain]
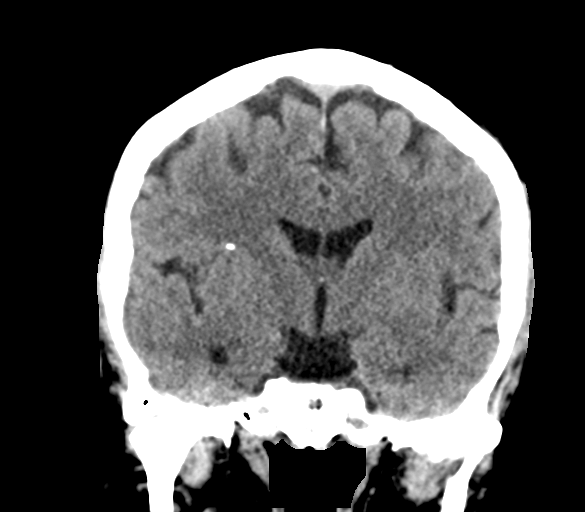
[im 37/67  brain]
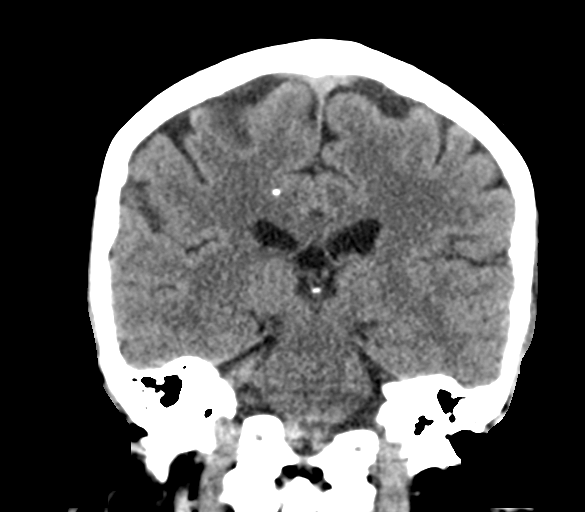

[Series 6: head 3.0 mpr sag · sagittal · 0.33mm/px · 3 of 67 slices shown]
[im 23/67  brain]
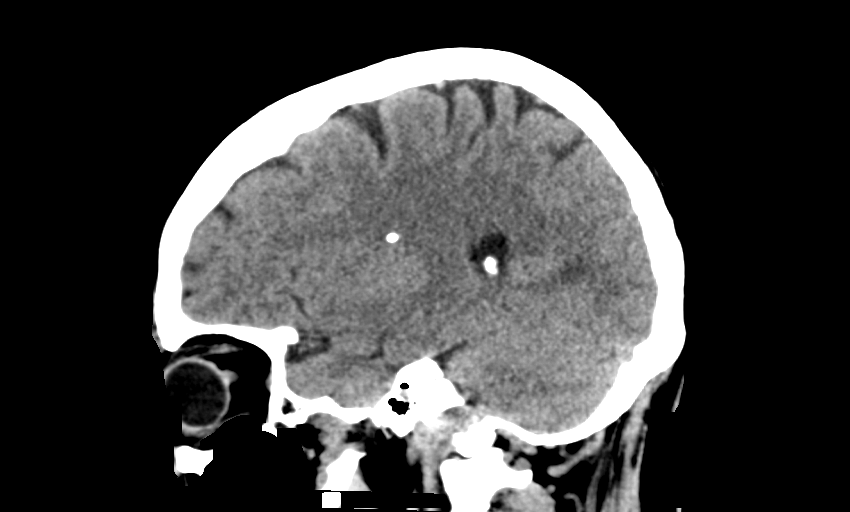
[im 34/67  brain]
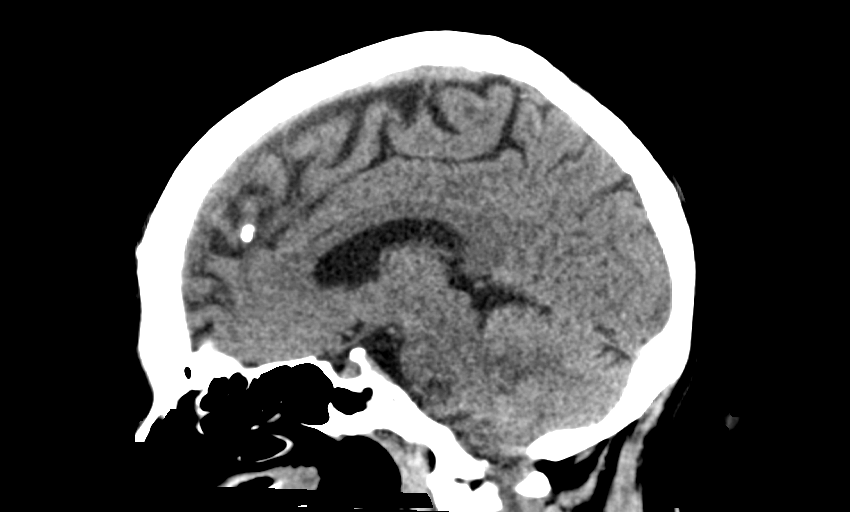
[im 45/67  brain]
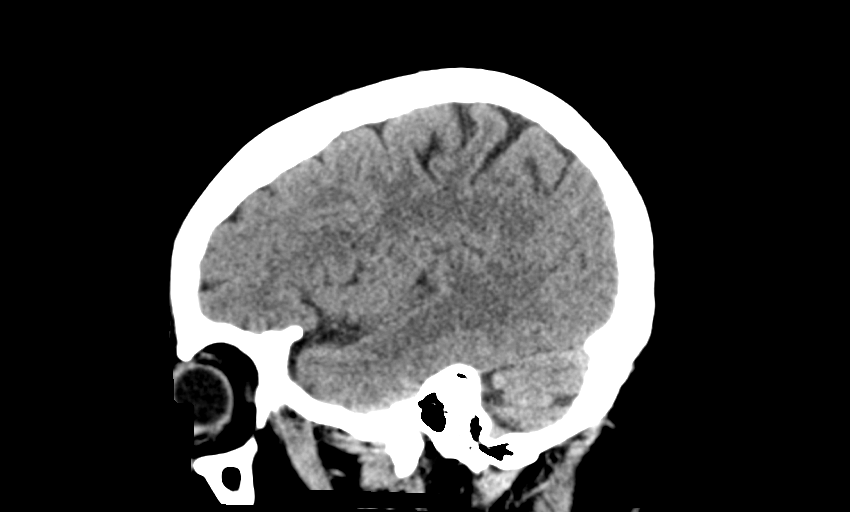

[14 of 47 positions shown; findings below may reference images not displayed]

FINDINGS: Brain: No evidence of acute infarction, hemorrhage, hydrocephalus,
extra-axial collection or mass lesion/mass effect. Multiple
bilateral cerebral calcifications. No signs of parenchymal edema by
CT.

At the foramen magnum there is a 1.1 x 0.4 cm area of increased
density which is focal and elliptical or elongated. (Image [DATE])

Vascular: No hyperdense vessel or unexpected calcification. Area at
the foramen magnum [REDACTED] be dilated RIGHT vertebral artery but is
unclear.

Skull: Normal. Negative for fracture or focal lesion.

Sinuses/Orbits: Visualized paranasal sinuses and orbits are
unremarkable.

Other: None
IMPRESSION: 1. No acute intracranial abnormality.
2. Area at the foramen magnum there is a focal and elliptical or
elongated area of increased density which may represent a small
meningioma or aneurysmal dilation of the vertebral artery. MRI of
the brain with and without contrast and MRA of the head is suggested
for further evaluation.
3. Multiple bilateral cerebral calcifications. Likely related to
prior neurocysticercosis.

## 2020-11-15 IMAGING — MR MR MRA HEAD W/O CM
1 series · 19 of 48 positions shown · IV contrast (gadavist)
Comparison: No pertinent prior exam.

CLINICAL DATA: Headache

EXAM:
MRI HEAD WITHOUT AND WITH CONTRAST
MRA HEAD WITHOUT CONTRAST
TECHNIQUE: Multiplanar, multi-echo pulse sequences of the brain and surrounding
structures were acquired without and with intravenous contrast.
Angiographic images of the Circle of Willis were acquired using MRA
technique without intravenous contrast.
CONTRAST:  7mL GADAVIST GADOBUTROL 1 MMOL/ML IV SOLN

[Series 3: ax (id) · axial · 1.0mm · 0.43mm/px · z∈[-86,-7]mm · 19 of 176 slices shown]
[im 1/176]
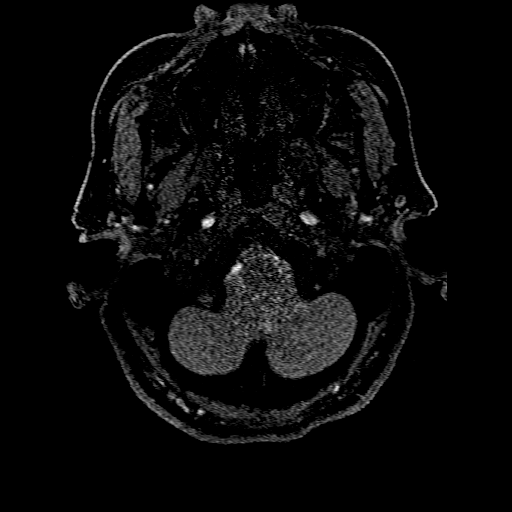
[im 4/176]
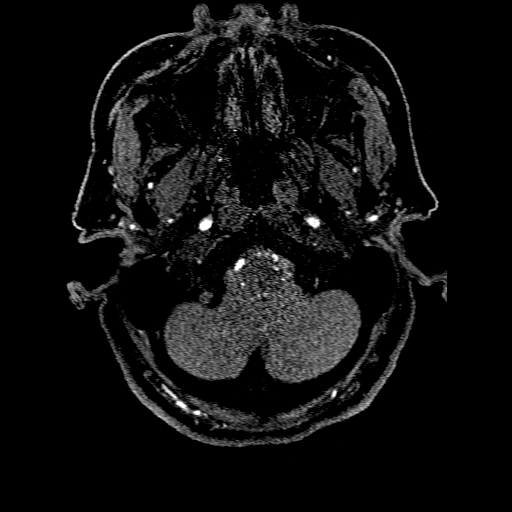
[im 8/176]
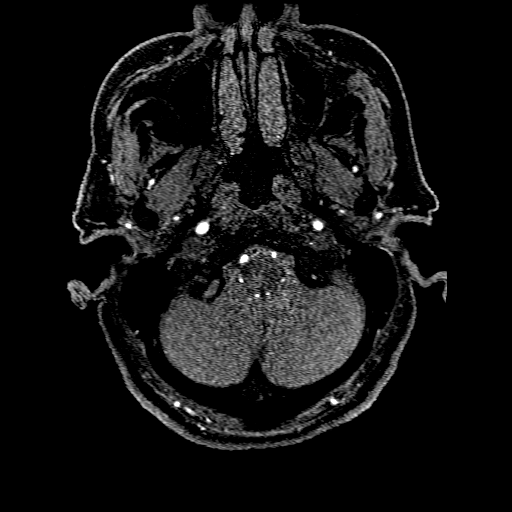
[im 12/176]
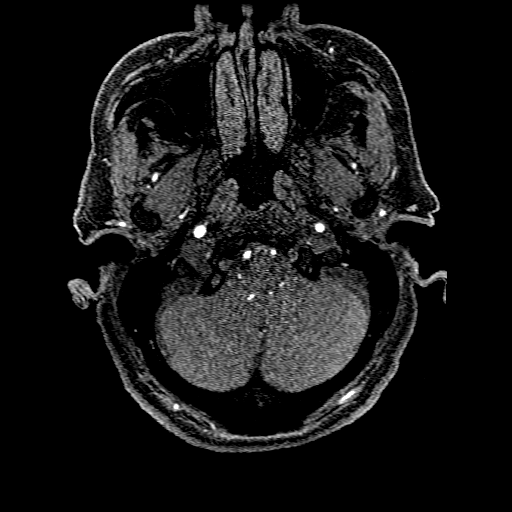
[im 15/176]
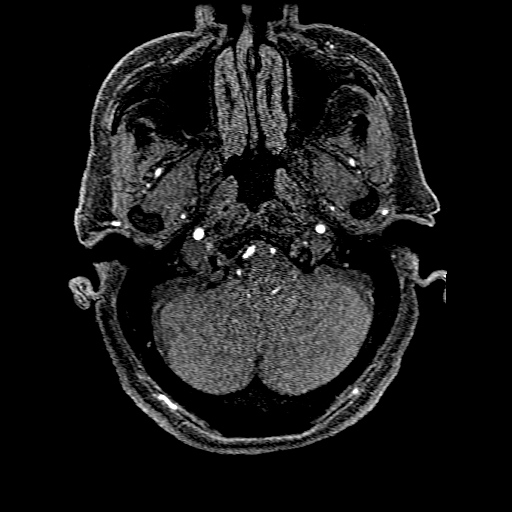
[im 19/176]
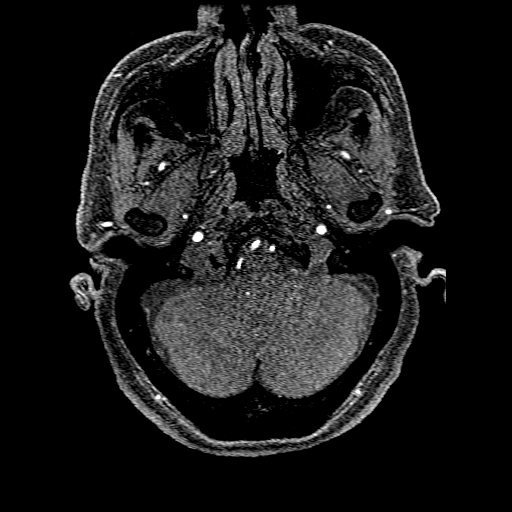
[im 23/176]
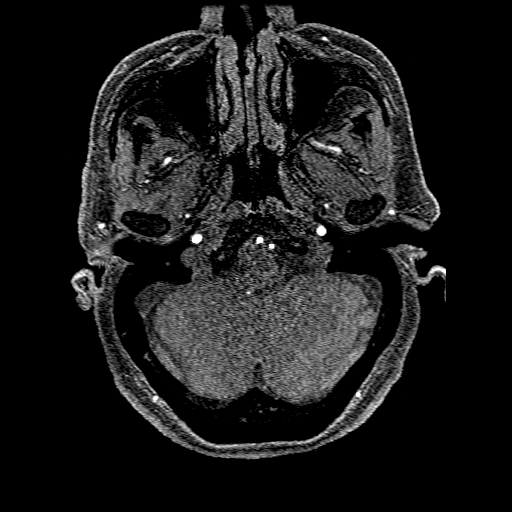
[im 27/176]
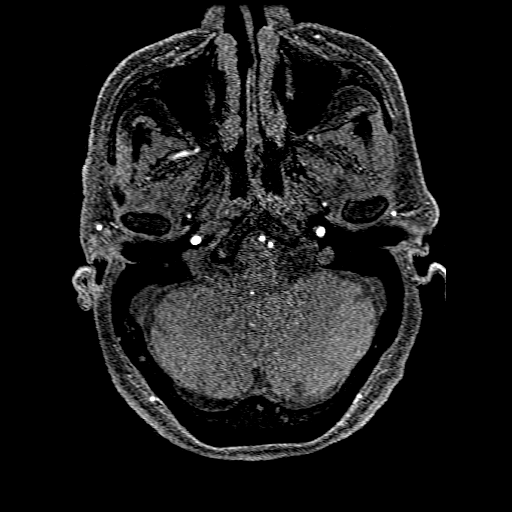
[im 30/176]
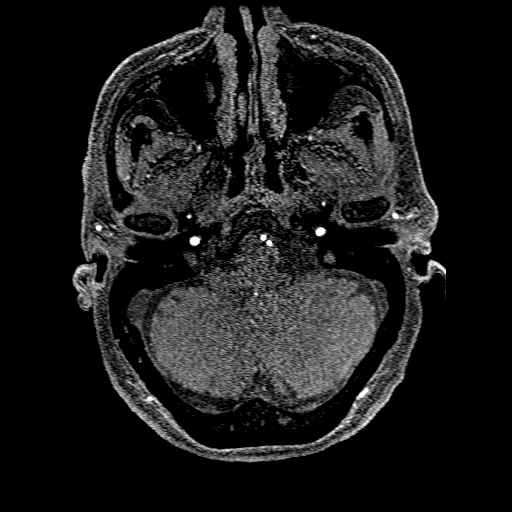
[im 34/176]
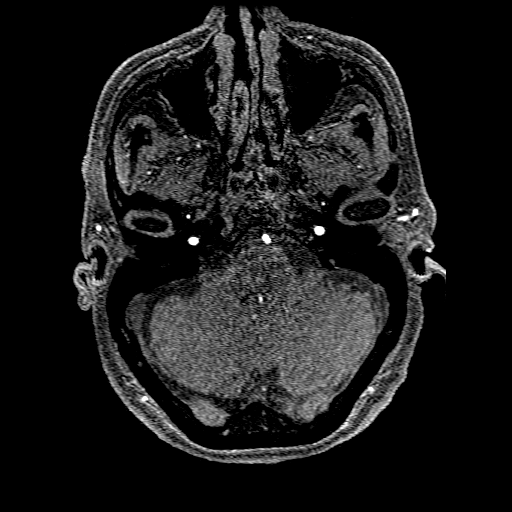
[im 38/176]
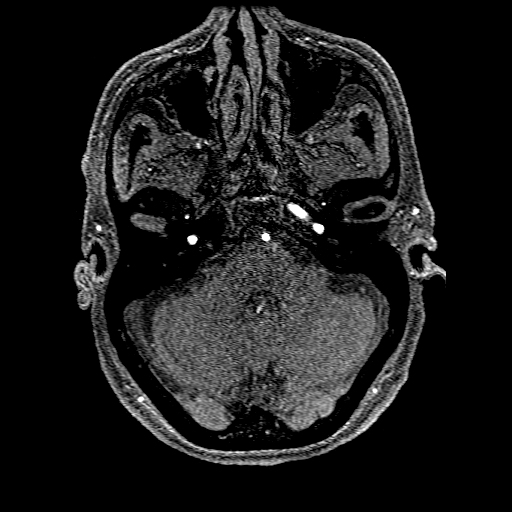
[im 56/176]
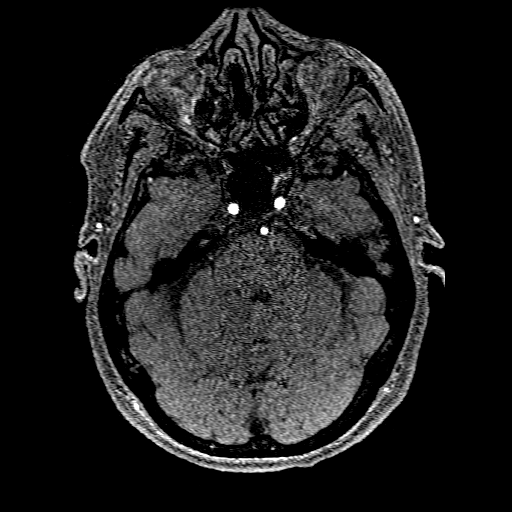
[im 79/176]
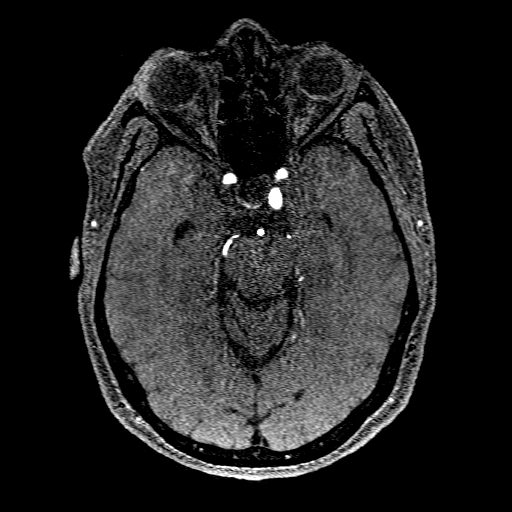
[im 90/176]
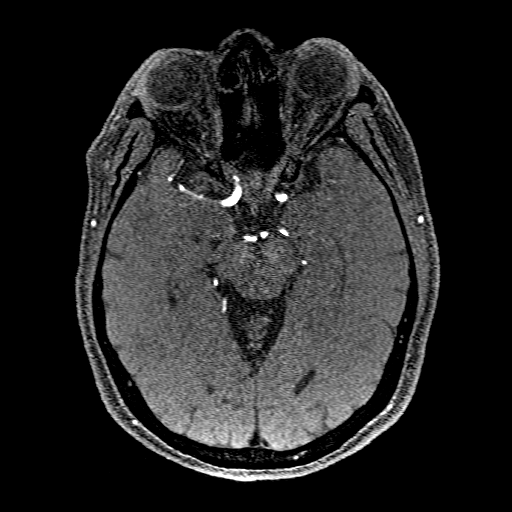
[im 101/176]
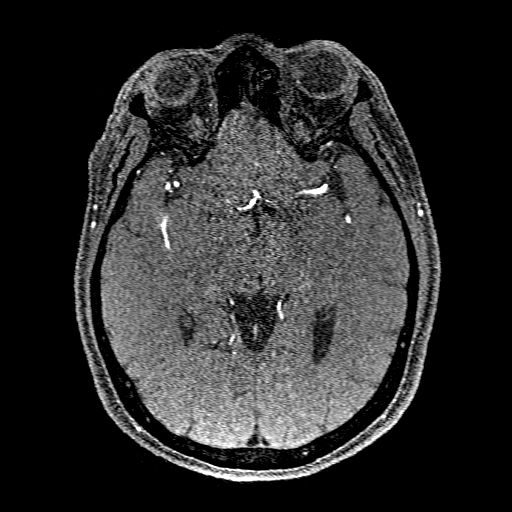
[im 123/176]
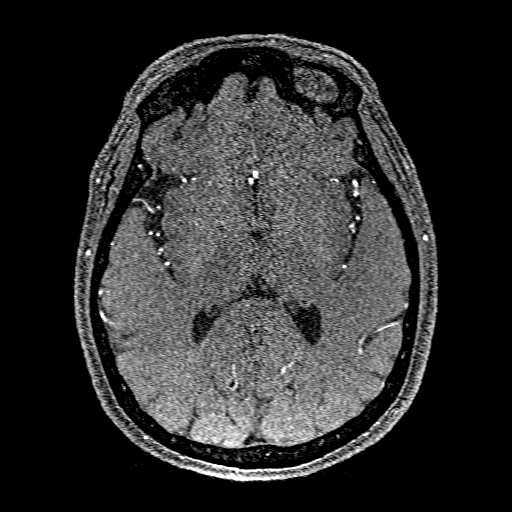
[im 146/176]
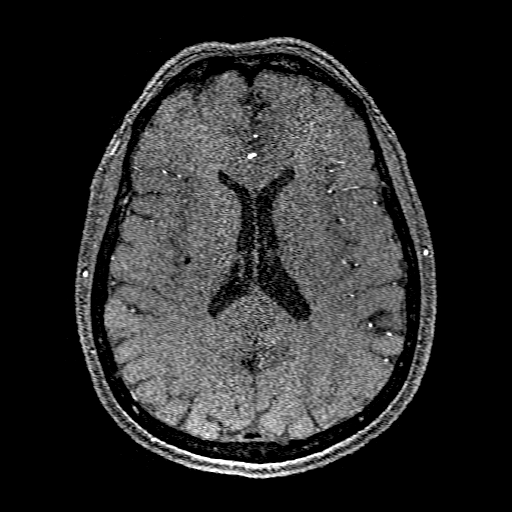
[im 149/176]
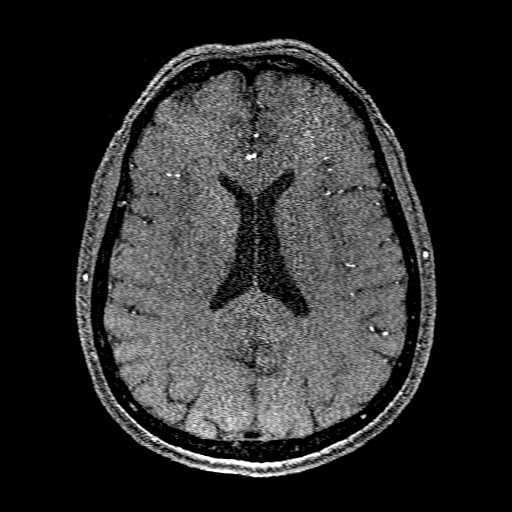
[im 168/176]
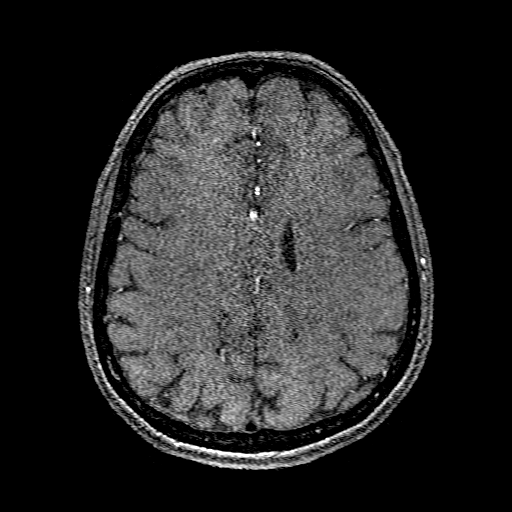

[19 of 48 positions shown; findings below may reference images not displayed]

FINDINGS: MRI HEAD FINDINGS

Brain: No acute infarct, mass effect or extra-axial collection.
Three scattered foci of chronic microhemorrhage. Normal white matter
signal, parenchymal volume and CSF spaces. The midline structures
are normal. There is no abnormal contrast enhancement.

Vascular: Major flow voids are preserved.

Skull and upper cervical spine: Normal calvarium and skull base.
Visualized upper cervical spine and soft tissues are normal.

Sinuses/Orbits:No paranasal sinus fluid levels or advanced mucosal
thickening. No mastoid or middle ear effusion. Normal orbits.

MRA HEAD FINDINGS

POSTERIOR CIRCULATION:

--Vertebral arteries: Normal

--Inferior cerebellar arteries: Normal.

--Basilar artery: Normal.

--Superior cerebellar arteries: Normal.

--Posterior cerebral arteries: Normal.

ANTERIOR CIRCULATION:

--Intracranial internal carotid arteries: Normal.

--Anterior cerebral arteries (ACA): Normal.

--Middle cerebral arteries (MCA): Normal.

ANATOMIC VARIANTS: None
IMPRESSION: 1. No acute intracranial abnormality.
2. Normal intracranial MRA.
3. Hyperdense area described on earlier head CT corresponds to the
normal right vertebral artery.

## 2020-11-15 MED ORDER — GADOBUTROL 1 MMOL/ML IV SOLN
7.0000 mL | Freq: Once | INTRAVENOUS | Status: AC | PRN
  Administered 2020-11-15: 7 mL via INTRAVENOUS

## 2020-11-15 NOTE — ED Notes (Signed)
Pt ambulatory to bathroom and back without difficulty, steady gait.

## 2020-11-15 NOTE — ED Notes (Signed)
Patient transported to MRI 

## 2020-11-15 NOTE — ED Triage Notes (Signed)
Pt here d/t headache and states feeling funny that started yesterday. Pt states her blood pressure is higher than usual.

## 2020-11-15 NOTE — ED Notes (Signed)
Pt back from MRI, placed back on ccm.

## 2020-11-15 NOTE — ED Provider Notes (Signed)
[facility]   CSN: [id number] Arrival date & time: 11/15/20  1157     History Chief Complaint  Patient presents with  . Headache    Sara [name] is a 70 y.o. female.  Sara Musterlicia de La Overton MamCruz [name] is a 70 y.o. female with no significant past medical history, who presents to the ED for evaluation of headache and elevated blood pressure.  Patient reports yesterday she was working out in the yard with her husband and when she came in she noted a slight headache.  She reports this was mild and gradual in onset and she did not have any associated symptoms with headache.  Did take her blood pressure at home and noted systolic in the 140s, reports usually her blood pressure ranges in the 110-120 range.  Headaches seem to ease off, but a few hours later she noticed she was having a conversation with her daughter and felt confused and like her text conversation did not make sense.  She reports she went to sleep, woke up this morning without headache, but still did not feel quite right.  Reports she does not typically get headaches or have high blood pressure.  Denies any associated visual changes, dizziness or balance issues.  No numbness or weakness.  No difficulty walking around.  No chest pain, shortness of breath, lower extremity swelling, abdominal pain, nausea, vomiting or diarrhea.  No fevers or chills.  No dysuria.  No prior history of stroke.  The history is provided by the patient and medical records.       History reviewed. No pertinent past medical history.  There are no problems to display for this patient.   Past Surgical History:  Procedure Laterality Date  . ABDOMINAL HYSTERECTOMY    . CHOLECYSTECTOMY       OB History   No obstetric history on file.     History reviewed. No pertinent family history.  Social History   Tobacco Use  . Smoking status: Never Smoker  . Smokeless tobacco: Never Used  Substance Use  Topics  . Alcohol use: Yes    Comment: rare  . Drug use: Never    Home Medications Prior to Admission medications   Not on File    Allergies    Patient has no allergy information on record.  Review of Systems   Review of Systems  Constitutional: Negative for chills and fever.  HENT: Negative.   Eyes: Negative for visual disturbance.  Respiratory: Negative for cough and shortness of breath.   Cardiovascular: Negative for chest pain and leg swelling.  Gastrointestinal: Negative for abdominal pain, nausea and vomiting.  Genitourinary: Negative for dysuria and frequency.  Musculoskeletal: Negative for myalgias, neck pain and neck stiffness.  Neurological: Positive for headaches. Negative for dizziness, tremors, seizures, syncope, facial asymmetry, speech difficulty, weakness, light-headedness and numbness.  Psychiatric/Behavioral: Positive for confusion.  All other systems reviewed and are negative.   Physical Exam Updated Vital Signs BP (!) 142/48 (BP Location: Right Arm)   Pulse 65   Temp 98.8 F (37.1 C)   Resp 18   Ht 5\' 1"  (1.549 m)   Wt 68 kg   SpO2 100%   BMI 28.34 kg/m   Physical Exam Vitals and nursing note reviewed.  Constitutional:      General: She is not in acute distress.    Appearance: She is well-developed. She is not diaphoretic.     Comments: Alert, oriented, well-appearing and  in no distress  HENT:     Head: Normocephalic and atraumatic.     Mouth/Throat:     Mouth: Mucous membranes are moist.     Pharynx: Oropharynx is clear.  Eyes:     General:        Right eye: No discharge.        Left eye: No discharge.     Extraocular Movements: Extraocular movements intact.     Pupils: Pupils are equal, round, and reactive to light.  Cardiovascular:     Rate and Rhythm: Normal rate and regular rhythm.     Heart sounds: Normal heart sounds.  Pulmonary:     Effort: Pulmonary effort is normal. No respiratory distress.     Breath sounds: Normal breath  sounds. No wheezing or rales.     Comments: Respirations equal and unlabored, patient able to speak in full sentences, lungs clear to auscultation bilaterally  Abdominal:     General: Bowel sounds are normal. There is no distension.     Palpations: Abdomen is soft. There is no mass.     Tenderness: There is no abdominal tenderness. There is no guarding.     Comments: Abdomen soft, nondistended, nontender to palpation in all quadrants without guarding or peritoneal signs   Musculoskeletal:        General: No deformity.     Cervical back: Neck supple.  Skin:    General: Skin is warm and dry.     Capillary Refill: Capillary refill takes less than 2 seconds.  Neurological:     Mental Status: She is alert and oriented to person, place, and time.     GCS: GCS eye subscore is 4. GCS verbal subscore is 5. GCS motor subscore is 6.     Coordination: Coordination normal.     Comments: Speech is clear, able to follow commands CN III-XII intact Normal strength in upper and lower extremities bilaterally including dorsiflexion and plantar flexion, strong and equal grip strength Sensation normal to light and sharp touch Moves extremities without ataxia, coordination intact Normal finger to nose and rapid alternating movements No pronator drift   Psychiatric:        Mood and Affect: Mood normal.        Behavior: Behavior normal.     ED Results / Procedures / Treatments   Labs (all labs ordered are listed, but only abnormal results are displayed) Labs Reviewed  COMPREHENSIVE METABOLIC PANEL - Abnormal; Notable for the following components:      Result Value   Glucose, Bld 103 (*)    All other components within normal limits  URINALYSIS, ROUTINE W REFLEX MICROSCOPIC - Abnormal; Notable for the following components:   Color, Urine STRAW (*)    All other components within normal limits  CBC WITH DIFFERENTIAL/PLATELET    EKG EKG Interpretation  Date/Time:  Friday Nov 15 2020 16:12:24  EDT Ventricular Rate:  58 PR Interval:  125 QRS Duration: 97 QT Interval:  433 QTC Calculation: 426 R Axis:   -47 Text Interpretation: Sinus rhythm Left axis deviation RSR' in V1 or V2, right VCD or RVH Nonspecific TW abnormalities No previous ECGs available Confirmed by Alvira Monday ([id number]) on 11/15/2020 5:04:25 PM   Radiology CT Head Wo Contrast  Result Date: 11/15/2020 CLINICAL DATA:  New or worsening headache in a 70 year old female. EXAM: CT HEAD WITHOUT CONTRAST TECHNIQUE: Contiguous axial images were obtained from the base of the skull through the vertex without intravenous contrast. COMPARISON:  None.  FINDINGS: Brain: No evidence of acute infarction, hemorrhage, hydrocephalus, extra-axial collection or mass lesion/mass effect. Multiple bilateral cerebral calcifications. No signs of parenchymal edema by CT. At the foramen magnum there is a 1.1 x 0.4 cm area of increased density which is focal and elliptical or elongated. (Image 7/3) Vascular: No hyperdense vessel or unexpected calcification. Area at the foramen magnum in may be dilated RIGHT vertebral artery but is unclear. Skull: Normal. Negative for fracture or focal lesion. Sinuses/Orbits: Visualized paranasal sinuses and orbits are unremarkable. Other: None IMPRESSION: 1. No acute intracranial abnormality. 2. Area at the foramen magnum there is a focal and elliptical or elongated area of increased density which may represent a small meningioma or aneurysmal dilation of the vertebral artery. MRI of the brain with and without contrast and MRA of the head is suggested for further evaluation. 3. 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 Electronically Signed   By: Donzetta Kohut M.D.   On: 11/15/2020 17:00   MR ANGIO HEAD WO CONTRAST  Result Date: 11/15/2020 CLINICAL DATA:  Headache EXAM: MRI HEAD WITHOUT AND WITH CONTRAST MRA HEAD WITHOUT CONTRAST TECHNIQUE: Multiplanar, multi-echo pulse  sequences of the brain and surrounding structures were acquired without and with intravenous contrast. Angiographic images of the Circle of Willis were acquired using MRA technique without intravenous contrast. CONTRAST:  7mL GADAVIST GADOBUTROL 1 MMOL/ML IV SOLN COMPARISON:  No pertinent prior exam. FINDINGS: MRI HEAD FINDINGS Brain: No acute infarct, mass effect or extra-axial collection. Three scattered foci of chronic microhemorrhage. Normal white matter signal, parenchymal volume and CSF spaces. The midline structures are normal. There is no abnormal contrast enhancement. Vascular: Major flow voids are preserved. Skull and upper cervical spine: Normal calvarium and skull base. Visualized upper cervical spine and soft tissues are normal. Sinuses/Orbits:No paranasal sinus fluid levels or advanced mucosal thickening. No mastoid or middle ear effusion. Normal orbits. MRA HEAD FINDINGS POSTERIOR CIRCULATION: --Vertebral arteries: Normal --Inferior cerebellar arteries: Normal. --Basilar artery: Normal. --Superior cerebellar arteries: Normal. --Posterior cerebral arteries: Normal. ANTERIOR CIRCULATION: --Intracranial internal carotid arteries: Normal. --Anterior cerebral arteries (ACA): Normal. --Middle cerebral arteries (MCA): Normal. ANATOMIC VARIANTS: None IMPRESSION: 1. No acute intracranial abnormality. 2. Normal intracranial MRA. 3. Hyperdense area described on earlier head CT corresponds to the normal right vertebral artery. Electronically Signed   By: Deatra Robinson M.D.   On: 11/15/2020 20:25   MR Brain W and Wo Contrast  Result Date: 11/15/2020 CLINICAL DATA:  Headache EXAM: MRI HEAD WITHOUT AND WITH CONTRAST MRA HEAD WITHOUT CONTRAST TECHNIQUE: Multiplanar, multi-echo pulse sequences of the brain and surrounding structures were acquired without and with intravenous contrast. Angiographic images of the Circle of Willis were acquired using MRA technique without intravenous contrast. CONTRAST:  7mL GADAVIST  GADOBUTROL 1 MMOL/ML IV SOLN COMPARISON:  No pertinent prior exam. FINDINGS: MRI HEAD FINDINGS Brain: No acute infarct, mass effect or extra-axial collection. Three scattered foci of chronic microhemorrhage. Normal white matter signal, parenchymal volume and CSF spaces. The midline structures are normal. There is no abnormal contrast enhancement. Vascular: Major flow voids are preserved. Skull and upper cervical spine: Normal calvarium and skull base. Visualized upper cervical spine and soft tissues are normal. Sinuses/Orbits:No paranasal sinus fluid levels or advanced mucosal thickening. No mastoid or middle ear effusion. Normal orbits. MRA HEAD FINDINGS POSTERIOR CIRCULATION: --Vertebral arteries: Normal --Inferior cerebellar arteries: Normal. --Basilar artery: Normal. --Superior cerebellar arteries: Normal. --Posterior cerebral arteries: Normal. ANTERIOR CIRCULATION: --Intracranial internal carotid arteries: Normal. --Anterior cerebral arteries (ACA): Normal. --Middle  cerebral arteries (MCA): Normal. ANATOMIC VARIANTS: None IMPRESSION: 1. No acute intracranial abnormality. 2. Normal intracranial MRA. 3. Hyperdense area described on earlier head CT corresponds to the normal right vertebral artery. Electronically Signed   By: Deatra Robinson M.D.   On: 11/15/2020 20:25    Procedures Procedures   Medications Ordered in ED Medications  gadobutrol (GADAVIST) 1 MMOL/ML injection 7 mL (7 mLs Intravenous Contrast Given 11/15/20 1934)    ED Course  I have reviewed the triage vital signs and the nursing notes.  Pertinent labs & imaging results that were available during my care of the patient were reviewed by me and considered in my medical decision making (see chart for details).    MDM Rules/Calculators/A&P                         70 year old female presents with headache, elevated blood pressure and period of confusion.  Symptoms began yesterday when she had a mild gradual onset headache and a few hours  later noted that she seemed confused when having a text conversation with her daughter.  No other neurologic symptoms.  No visual changes, changes in speech, dizziness or balance issues, numbness or weakness.  No associated chest pain, shortness of breath, abdominal pain, fevers or urinary symptoms.  No prior history of strokes.  On arrival patient very mildly hypertensive, patient is alert and oriented, no signs of confusion and no neurologic deficits.  Additional history obtained via chart review.  Will evaluate with labs and CT of the head.  I have independently ordered, reviewed and interpreted all labs and imaging:  CBC: No leukocytosis, normal hemoglobin CMP: Glucose 103, no other electrolyte derangements, normal renal and liver function UA: No hematuria or signs of infection  EKG sinus rhythm with some nonspecific T wave changes, but no significant ischemic changes  CT head: No acute intracranial abnormality, there is an area at the foramen magnum where there is a focal an elliptical or elongated area of increased density which may represent a small meningioma or aneurysmal dilation of vertebral artery.  Recommend MRI of brain and MRA for further evaluation.  MRI and MRA are unremarkable, hyperdense area on CT corresponds to normal right vertebral artery.  I discussed symptoms and imaging with Dr. Iver Nestle with neurology, given period of confusion concern for possible TIA, recommended trying to look at text conversation, to see if there is any significant errors or word salad, if this is the case would recommend admission but otherwise can likely follow-up as an outpatient.  Went back to speak with patient, unfortunately she had deleted the text conversation with her daughter, but in speaking with her in greater detail about this it sounds like she more so had forgotten that she had already had a similar conversation with her daughter and asked repeat questioning, but did not have word  salad, or changes in speech.  Given this information feel comfortable sending patient home with outpatient PCP and neurology follow-up.  Strict return precautions provided.  Blood pressure returned to baseline here in the ED without any intervention so would not initiate any blood pressure treatment at this time.  Patient expresses understanding and agreement.  Discharged home in good condition.  Final Clinical Impression(s) / ED Diagnoses Final diagnoses:  Acute nonintractable headache, unspecified headache type  Elevated blood pressure reading    Rx / DC Orders ED Discharge Orders    None       Dartha Lodge,  PA-C 11/19/20 2242    Alvira Monday, MD 11/26/20 [phone]

## 2020-11-15 NOTE — ED Provider Notes (Signed)
Emergency Medicine Provider Triage Evaluation Note  Sara [name] , a 70 y.o. female  was evaluated in triage.  Pt complains of headache, felt unwell yesterday, feels confused at time but wasn't worried about it yesterday and took 2 advil. Woke up this morning with return of headache, checked BP 144/67. HA feels better now after eating breakfast, still has pain to right and left sides of head.   Review of Systems  Positive: HA  Negative: Weakness, numbness, CP, SHOB  Physical Exam  BP (!) 144/54   Pulse 73   Temp 98 F (36.7 C) (Oral)   Resp 15   SpO2 98%  Gen:   Awake, no distress   Resp:  Normal effort  MSK:   Moves extremities without difficulty  Other:  Normal gait and speech  Medical Decision Making  Medically screening exam initiated at 2:43 PM.  Appropriate orders placed.  Sara [name] was informed that the remainder of the evaluation will be completed by another provider, this initial triage assessment does not replace that evaluation, and the importance of remaining in the ED until their evaluation is complete.     Jeannie Fend, PA-C 11/15/20 1445    Blane Ohara, MD 11/15/20 [phone]

## 2020-11-15 NOTE — ED Notes (Signed)
Pt to CT

## 2020-11-15 NOTE — Discharge Instructions (Signed)
Your evaluation today has been reassuring.  MRI showed no signs of stroke or mass and vasculature in your brain looks normal.  Please make sure you are staying well-hydrated and continue to monitor your blood pressure at home.  Follow-up closely with your primary care doctor for further evaluation of your blood pressure, while it was initially mildly elevated it has returned to normal here without any medication.  I would also like for you to follow-up with neurology regarding the episode you had yesterday.

## 2020-11-15 NOTE — ED Notes (Signed)
PA at bedside.

## 2020-11-15 NOTE — ED Notes (Signed)
Pt provided meal tray

## 2021-01-03 ENCOUNTER — Encounter: Payer: Medicare Other | Admitting: Medical

## 2021-01-08 ENCOUNTER — Other Ambulatory Visit: Payer: Self-pay

## 2021-01-09 ENCOUNTER — Ambulatory Visit (INDEPENDENT_AMBULATORY_CARE_PROVIDER_SITE_OTHER): Payer: Medicare Other | Admitting: Medical

## 2021-01-09 VITALS — BP 117/50 | HR 57 | Resp 18 | Ht 61.0 in | Wt 150.0 lb

## 2021-01-09 DIAGNOSIS — R739 Hyperglycemia, unspecified: Secondary | ICD-10-CM | POA: Diagnosis not present

## 2021-01-09 DIAGNOSIS — E2839 Other primary ovarian failure: Secondary | ICD-10-CM

## 2021-01-09 DIAGNOSIS — Z13820 Encounter for screening for osteoporosis: Secondary | ICD-10-CM

## 2021-01-09 DIAGNOSIS — E785 Hyperlipidemia, unspecified: Secondary | ICD-10-CM | POA: Diagnosis not present

## 2021-01-09 LAB — COMPREHENSIVE METABOLIC PANEL
ALT: 11 U/L (ref 0–35)
AST: 16 U/L (ref 0–37)
Albumin: 4.1 g/dL (ref 3.5–5.2)
Alkaline Phosphatase: 122 U/L — ABNORMAL HIGH (ref 39–117)
BUN: 15 mg/dL (ref 6–23)
CO2: 29 mEq/L (ref 19–32)
Calcium: 9.2 mg/dL (ref 8.4–10.5)
Chloride: 107 mEq/L (ref 96–112)
Creatinine, Ser: 0.64 mg/dL (ref 0.40–1.20)
GFR: 88.5 mL/min (ref >60.00)
Glucose, Bld: 90 mg/dL (ref 70–99)
Potassium: 5.1 mEq/L (ref 3.5–5.1)
Sodium: 141 mEq/L (ref 135–145)
Total Bilirubin: 0.5 mg/dL (ref 0.2–1.2)
Total Protein: 7 g/dL (ref 6.0–8.3)

## 2021-01-09 LAB — LIPID PANEL
Cholesterol: 211 mg/dL — ABNORMAL HIGH (ref 0–200)
HDL: 89.7 mg/dL (ref >39.00)
LDL Cholesterol: 99 mg/dL (ref 0–99)
NonHDL: 121.5
Total CHOL/HDL Ratio: 2
Triglycerides: 111 mg/dL (ref 0.0–149.0)
VLDL: 22.2 mg/dL (ref 0.0–40.0)

## 2021-01-09 LAB — HEMOGLOBIN A1C: Hgb A1c MFr Bld: 5.5 % (ref 4.6–6.5)

## 2021-01-09 NOTE — Progress Notes (Signed)
Subjective:    Patient ID: Sara [name], female    DOB: 31-Oct-1950, 70 y.o.   MRN: [id number]  HPI  Pt in for follow up.  Pt has history of high cholesterol. But 10 months ago levels were good. Pt states trying to eat healthy.  Pt has occasional low back pain when she does some mild yard work. Describes some self limited lower back pain at times.   Pt has hx of cataracts and specialist has her on some eye drops.   One month ago had mild sugar elevation.    Pt summarizes he ED visit  end of May. I reviewed that visit.  Below is A/P. Pt has not had any recurrent significant ha, no confustion and no gross motor/sensory function deficits. Pt aware ED recommend follow with me and neurology. Pt declines referral to neurologist.    "70 year old female presents with headache, elevated blood pressure and period of confusion.  Symptoms began yesterday when she had a mild gradual onset headache and a few hours later noted that she seemed confused when having a text conversation with her daughter.  No other neurologic symptoms.  No visual changes, changes in speech, dizziness or balance issues, numbness or weakness.  No associated chest pain, shortness of breath, abdominal pain, fevers or urinary symptoms.  No prior history of strokes.  On arrival patient very mildly hypertensive, patient is alert and oriented, no signs of confusion and no neurologic deficits.   Additional history obtained via chart review.   Will evaluate with labs and CT of the head.   I have independently ordered, reviewed and interpreted all labs and imaging:   CBC: No leukocytosis, normal hemoglobin CMP: Glucose 103, no other electrolyte derangements, normal renal and liver function UA: No hematuria or signs of infection   EKG sinus rhythm with some nonspecific T wave changes, but no significant ischemic changes   CT head: No acute intracranial abnormality, there is an area at the foramen magnum where there  is a focal an elliptical or elongated area of increased density which may represent a small meningioma or aneurysmal dilation of vertebral artery.  Recommend MRI of brain and MRA for further evaluation.   MRI and MRA are unremarkable, hyperdense area on CT corresponds to normal right vertebral artery.   I discussed symptoms and imaging with Dr. Iver Nestle with neurology, given period of confusion concern for possible TIA, recommended trying to look at text conversation, to see if there is any significant errors or word salad, if this is the case would recommend admission but otherwise can likely follow-up as an outpatient.   Went back to speak with patient, unfortunately she had deleted the text conversation with her daughter, but in speaking with her in greater detail about this it sounds like she more so had forgotten that she had already had a similar conversation with her daughter and asked repeat questioning, but did not have word salad, or changes in speech.  Given this information feel comfortable sending patient home with outpatient PCP and neurology follow-up.  Strict return precautions provided.  Blood pressure returned to baseline here in the ED without any intervention so would not initiate any blood pressure treatment at this time.  Patient expresses understanding and agreement.  Discharged home in good condition."    Review of Systems  Constitutional:  Negative for chills, fatigue and fever.  HENT:  Negative for congestion, drooling and ear pain.   Respiratory:  Negative for cough, choking,  shortness of breath and wheezing.   Cardiovascular:  Negative for chest pain and palpitations.  Gastrointestinal:  Negative for abdominal pain.  Endocrine: Negative for polydipsia.  Genitourinary:  Negative for difficulty urinating, dysuria, flank pain, frequency and hematuria.  Musculoskeletal:  Negative for back pain.  Skin:  Negative for rash.  Hematological:  Negative for adenopathy. Does not  bruise/bleed easily.  Psychiatric/Behavioral:  Negative for behavioral problems, decreased concentration and dysphoric mood.     No past medical history on file.   Social History   Socioeconomic History   Marital status: Married    Spouse name: Not on file   Number of children: Not on file   Years of education: Not on file   Highest education level: Not on file  Occupational History   Not on file  Tobacco Use   Smoking status: Never   Smokeless tobacco: Never  Substance and Sexual Activity   Alcohol use: Yes    Comment: rare   Drug use: Never   Sexual activity: Not on file  Other Topics Concern   Not on file  Social History Narrative   Not on file   Social Determinants of Health   Financial Resource Strain: Not on file  Food Insecurity: Not on file  Transportation Needs: Not on file  Physical Activity: Not on file  Stress: Not on file  Social Connections: Not on file  Intimate Partner Violence: Not on file    Past Surgical History:  Procedure Laterality Date   ABDOMINAL HYSTERECTOMY     CHOLECYSTECTOMY      No family history on file.  Not on File  No current outpatient medications on file prior to visit.   No current facility-administered medications on file prior to visit.    BP (!) 117/50   Pulse (!) 57   Resp 18   Ht 5\' 1"  (1.549 m)   Wt 150 lb (68 kg)   SpO2 98%   BMI 28.34 kg/m        Objective:   Physical Exam  General Mental Status- Alert. General Appearance- Not in acute distress.   Skin General: Color- Normal Color. Moisture- Normal Moisture.  Neck Carotid Arteries- Normal color. Moisture- Normal Moisture. No carotid bruits. No JVD.  Chest and Lung Exam Auscultation: Breath Sounds:-Normal.  Cardiovascular Auscultation:Rythm- Regular. Murmurs & Other Heart Sounds:Auscultation of the heart reveals- No Murmurs.  Abdomen Inspection:-Inspeection Normal. Palpation/Percussion:Note:No mass. Palpation and Percussion of the  abdomen reveal- Non Tender, Non Distended + BS, no rebound or guarding.   Neurologic Cranial Nerve exam:- CN III-XII intact(No nystagmus), symmetric smile. Strength:- 5/5 equal and symmetric strength both upper and lower extremities.       Assessment & Plan:   Hx of high cholesterol.  Recently controlled by diet.  We will recheck lipid panel today.  History of recent minimal elevated sugar.  Will check metabolic panel and A1c.  Recommend low sugar diet.  On review looks like you have not had recent bone density studies.  You report in the past had normal DEXA scan but that was years ago.  Placed order today.  You can go downstairs and talk with radiology about scheduling that.  About 6 weeks ago episode of elevated blood pressure, headache and confusion.  On review work-up from emergency department was negative.  No recurrent significant headaches, elevated blood pressure, motor/sensory function deficits or mental status changes.  You declined referral to neurologist.  Need to watch you closely and if you had any  symptoms would definitely recommend checking your blood pressure.  If any recurrent Symptoms As before Return to the Emergency Department.  If Symptoms Do Recur Then Would Definitely Recommend Following through with Neurology Appointment.  Presently You Declined That Referral.  Follow up  in 3 months or as needed  [employer], [employer]

## 2021-01-09 NOTE — Patient Instructions (Addendum)
Hx of high cholesterol.  Recently controlled by diet.  We will recheck lipid panel today.  History of recent minimal elevated sugar.  Will check metabolic panel and A1c.  Recommend low sugar diet.  On review looks like you have not had recent bone density studies.  You report in the past had normal DEXA scan but that was years ago.  Placed order today.  You can go downstairs and talk with radiology about scheduling that.  About 6 weeks ago episode of elevated blood pressure, headache and confusion.  On review work-up from emergency department was negative.  No recurrent significant headaches, elevated blood pressure, motor/sensory function deficits or mental status changes.  You declined referral to neurologist.  Need to watch you closely and if you had any symptoms would definitely recommend checking your blood pressure.  If any recurrent Symptoms As before Return to the Emergency Department.  If Symptoms Do Recur Then Would Definitely Recommend Following through with Neurology Appointment.  Presently You Declined That Referral.  Follow up  in 3 months or as needed

## 2021-01-16 ENCOUNTER — Ambulatory Visit (HOSPITAL_BASED_OUTPATIENT_CLINIC_OR_DEPARTMENT_OTHER)
Admission: RE | Admit: 2021-01-16 | Discharge: 2021-01-16 | Disposition: A | Discharge status: Home / Self Care | Payer: Medicare Other | Source: Ambulatory Visit | Attending: Medical | Admitting: Medical

## 2021-01-16 ENCOUNTER — Other Ambulatory Visit: Payer: Self-pay

## 2021-01-16 DIAGNOSIS — E2839 Other primary ovarian failure: Secondary | ICD-10-CM | POA: Insufficient documentation

## 2021-01-16 DIAGNOSIS — Z13820 Encounter for screening for osteoporosis: Secondary | ICD-10-CM | POA: Insufficient documentation

## 2021-01-17 ENCOUNTER — Telehealth: Payer: Self-pay | Admitting: Medical

## 2021-01-17 DIAGNOSIS — M810 Age-related osteoporosis without current pathological fracture: Secondary | ICD-10-CM

## 2021-01-17 MED ORDER — ALENDRONATE SODIUM 10 MG PO TABS: ORAL_TABLET | ORAL | 11 refills | Status: DC

## 2021-01-17 NOTE — Telephone Encounter (Signed)
Future vit d order placed. 

## 2021-01-22 ENCOUNTER — Other Ambulatory Visit: Payer: Self-pay

## 2021-02-10 ENCOUNTER — Ambulatory Visit: Payer: Medicare Other | Admitting: Neurology

## 2021-02-10 ENCOUNTER — Encounter: Payer: Self-pay | Admitting: Neurology

## 2021-02-10 ENCOUNTER — Other Ambulatory Visit: Payer: Self-pay

## 2021-02-10 VITALS — BP 130/65 | HR 59 | Ht 60.0 in | Wt 148.8 lb

## 2021-02-10 DIAGNOSIS — R42 Dizziness and giddiness: Secondary | ICD-10-CM | POA: Diagnosis not present

## 2021-02-10 DIAGNOSIS — G44209 Tension-type headache, unspecified, not intractable: Secondary | ICD-10-CM | POA: Diagnosis not present

## 2021-02-10 MED ORDER — CYCLOBENZAPRINE HCL 10 MG PO TABS: 10.0000 mg | ORAL_TABLET | Freq: Every evening | ORAL | 1 refills | Status: DC | PRN

## 2021-02-10 NOTE — Progress Notes (Signed)
Subjective:    Patient ID: Sara [name] is a 70 y.o. female.  HPI    Huston Foley, MD, PhD [facility] [address] [address] [place], [place] [address]  I saw patient, Sara [name], as a referral from the emergency room for her headache.  The patient is unaccompanied today.  [name] 20-year-old right-handed woman with a benign medical history, other than low back pain, glaucoma, and osteoporosis, who presented to the emergency room on 11/15/2020 with headache and elevated blood pressure for her.  She reported a blood pressure systolic in the 140s whereas she typically has a lower systolic blood pressure in the 110s to 120s.  She had been working in the yard.  I reviewed the emergency room records.  She had a systolic blood pressure of 142 in the emergency room.  She had multiple imaging tests including a head CT without contrast on 11/15/2020 and I reviewed the results:   IMPRESSION: 1. No acute intracranial abnormality. 2. Area at the foramen magnum there is a focal and elliptical or elongated area of increased density which may represent a small meningioma or aneurysmal dilation of the vertebral artery. MRI of the brain with and without contrast and MRA of the head is suggested for further evaluation. 3. 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  She had a brain MRI with and without contrast as well as MR angiogram of the head without contrast on 11/15/2020 and I reviewed the results:  IMPRESSION: 1. No acute intracranial abnormality. 2. Normal intracranial MRA. 3. Hyperdense area described on earlier head CT corresponds to the normal right vertebral artery.   She reports that when she first started feeling bad, she had dizziness initially, she had been working outside and it was really hot.  When she came inside she had a mild headache which was achy and pressure-like, global.   She used to have migraines in the distant past but they improved.  This was not like a migraine.  She did not have any sudden onset of one-sided weakness or numbness or tingling or droopy face or slurring of speech but felt dizzy.  She did not feel a spinning sensation but does describe feeling slightly off balance.  She usually hydrates well.  She limits her caffeine to 1 cup of coffee in the morning.  She drinks alcohol in the form of wine or a wine cooler, up to 3-4 times a week, small amount, estimates that it is about 4 ounces.  Her headache is not currently present.  It comes and goes, dizziness also improved, she does admit to having stress.  This could be a trigger.  Even the blood pressure riding a little higher could be due to stress she admits.  She reports that they moved from [place], [place] about a year ago and she is not fully settled and not necessarily happy here.  They have a daughter in [place] and altogether 4 granddaughters.  Her husband who is 10 years older, has a son in [place] but his son has not visited yet.  They now have a ticket to fly to [place] to visit their daughter.  Her husband typically drives even longer distance but daughter did not want him to drive at this point.  Patient reports that they sold their home in [place], she was happy in her home, she is not quite happy here.  They have no family  close by.  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  She did use earplugs.  She reports having intermittent noise in both ears.  She has not had any significant hearing loss as far she can tell.  She had an updated eye exam.  She has glaucoma.  She had her cataracts taken out.  Her eye exam was last week.  She was recently started on gabapentin at bedtime, 100 mg strength up to 2 pills at night.  This was prescribed by orthopedics for back  pain.  She reports that she only takes 1 at night and it helps.  She also takes latanoprost eyedrops for her glaucoma and Fosamax for her osteoporosis.  Her Past Medical History Is Significant For: History reviewed. No pertinent past medical history.  Her Past Surgical History Is Significant For: Past Surgical History:  Procedure Laterality Date   ABDOMINAL HYSTERECTOMY     CHOLECYSTECTOMY      Her Family History Is Significant For: Family History  Problem Relation Age of Onset   Headache Neg Hx     Her Social History Is Significant For: Social History   Socioeconomic History   Marital status: Married    Spouse name: Not on file   Number of children: 1   Years of education: Not on file   Highest education level: Not on file  Occupational History   Not on file  Tobacco Use   Smoking status: Never   Smokeless tobacco: Never  Vaping Use   Vaping Use: Never used  Substance and Sexual Activity   Alcohol use: Yes    Comment: rare   Drug use: Never   Sexual activity: Not Currently  Other Topics Concern   Not on file  Social History Narrative   Not on file   Social Determinants of Health   Financial Resource Strain: Not on file  Food Insecurity: Not on file  Transportation Needs: Not on file  Physical Activity: Not on file  Stress: Not on file  Social Connections: Not on file    Her Allergies Are:  Not on File:   Her Current Medications Are:  Outpatient Encounter Medications as of 02/10/2021  Medication Sig   alendronate (FOSAMAX) 10 MG tablet Take with a full glass of water on an empty stomach.   CALCIUM PO Take by mouth daily.   Omega-3 Fatty Acids (FISH OIL PO) Take by mouth daily.   No facility-administered encounter medications on file as of 02/10/2021.   Review of Systems:  Out of a complete 14 point review of systems, all are reviewed and negative with the exception of these symptoms as listed below:   Review of Systems  Neurological:        Pt  stated in June she started to get dizzy and headaches when she was out in the yard working.  Pt took her BP (140/70) Pt went [facility] ED and she got a CT Scan and MRI and everything was clear.Pt still is still worried because of the headaches.      Objective:  Neurological Exam  Physical Exam Physical Examination:   Vitals:   02/10/21 0802  BP: 130/65  Pulse: (!) 59   General Examination: The patient is a very pleasant 70 y.o. female in no acute distress. She appears well-developed and well-nourished and well groomed.  She  denies any orthostatic lightheadedness or spinning sensation.  HEENT: Normocephalic, atraumatic, pupils are equal, round and reactive to light and accommodation. Funduscopic exam is normal with sharp disc margins noted. Extraocular tracking is good without limitation to gaze excursion or nystagmus noted. Normal smooth pursuit is noted. Hearing is grossly intact. Tympanic membranes are clear bilaterally. Face is symmetric with normal facial animation and normal facial sensation. Speech is clear with no dysarthria noted. There is no hypophonia. There is no lip, neck/head, jaw or voice tremor. Neck is supple with full range of passive and active motion. There are no carotid bruits on auscultation. Oropharynx exam reveals: moderate mouth dryness, good dental hygiene. Tongue protrudes centrally and palate elevates symmetrically.   Chest: Clear to auscultation without wheezing, rhonchi or crackles noted.  Heart: S1+S2+0, regular and normal without murmurs, rubs or gallops noted.   Abdomen: Soft, non-tender and non-distended with normal bowel sounds appreciated on auscultation.  Extremities: There is no pitting edema in the distal lower extremities bilaterally. Pedal pulses are intact.  Skin: Warm and dry without trophic changes noted. There are no varicose veins.  Musculoskeletal: exam reveals no obvious joint deformities, tenderness or joint swelling or erythema.    Neurologically:  Mental status: The patient is awake, alert and oriented in all 4 spheres. Her immediate and remote memory, attention, language skills and fund of knowledge are appropriate. There is no evidence of aphasia, agnosia, apraxia or anomia. Speech is clear with normal prosody and enunciation. Thought process is linear. Mood is normal and affect is normal.  She briefly gets tearful when she talks about her move from [place].   Cranial nerves II - XII are as described above under HEENT exam. In addition: shoulder shrug is normal with equal shoulder height noted. Motor exam: Normal bulk, strength and tone is noted. There is no drift, tremor or rebound. Romberg is negative. Reflexes are 1+ throughout. Babinski: Toes are flexor bilaterally. Fine motor skills and coordination: intact with normal finger taps, normal hand movements, normal rapid alternating patting, normal foot taps and normal foot agility.  Cerebellar testing: No dysmetria or intention tremor on finger to nose testing. Heel to shin is unremarkable bilaterally. There is no truncal or gait ataxia.  Sensory exam: intact to light touch, vibration, temperature sense in the upper and lower extremities.  Gait, station and balance: She stands easily. No veering to one side is noted. No leaning to one side is noted. Posture is age-appropriate and stance is narrow based. Gait shows normal stride length and normal pace. No problems turning are noted. Tandem walk is slightly challenging but doable.    Assessment and Plan:   In summary, Helmut Musterlicia de La Overton MamCruz Blas is a very pleasant 70 y.o.-year old female with a benign medical history, other than low back pain, glaucoma, and osteoporosis, who presents for evaluation of her recurrent headaches.  She also reports intermittent dizziness.  Her history and description of her headache are not supportive of migraines.  She does have a remote history of migraines herself.  She has had some elevated  blood pressure values which are not significantly high but high for her.  She endorses stress.  She is advised that her headaches are likely tied in with stress as a trigger and likely tension headaches.  She agrees that stress has been more lately and she has struggled in the past year since they moved from [place].  She has not settled in and is not happy here.  She is content  that she is able to see her daughter soon.  They are flying to [place] soon.  She is relieved that her husband will be driving a long distance.  I suggested for symptomatic relief of her headaches to try low-dose muscle relaxant.  I advised her start Flexeril 10 mg strength as needed at bedtime but cautioned her regarding the sedating properties of the medication.  She also takes gabapentin 100 mg at bedtime.  She reports that she was prescribed 200 mg at bedtime but only ended up taking 1 pill.  She is advised that gabapentin may also help her headache but for now she can use low-dose Flexeril even half a pill of the 10 mg strength as needed.  She is reassured that her exam is normal and recent brain scans were benign.  She had an updated eye exam.  We talked about headache triggers.  She is advised to stay well-hydrated and well rested.  She is advised to follow-up routinely to see one of our nurse practitioners in 3 months, sooner if needed.  I answered all her questions today and she was in agreement.   Huston Foley, MD, PhD

## 2021-02-10 NOTE — Patient Instructions (Addendum)
It was nice to meet you today.  I believe your headaches are tension headaches, rather than migrainous.  You have a normal neurological exam which is reassuring.  For your intermittent headaches I suggest a small dose of a muscle relaxer called Flexeril.  I will prescribe a 10 mg strength, you can take half a pill to up to 1 pill at night as needed.  Please be aware that this medication can make you drowsy. Side effects may include dizziness, sleepiness, mouth dryness, feeling off balance, so please be mindful. You may not be able to drive after taking it.   Please remember, common headache triggers are: sleep deprivation, dehydration, overheating, stress, hypoglycemia or skipping meals and blood sugar fluctuations, excessive pain medications or excessive alcohol use or caffeine withdrawal. Some people have food triggers such as aged cheese, orange juice or chocolate, especially dark chocolate, or MSG (monosodium glutamate). Try to avoid these headache triggers as much possible. It may be helpful to keep a headache diary to figure out what makes your headaches worse or brings them on and what alleviates them. Some people report headache onset after exercise but studies have shown that regular exercise may actually prevent headaches from coming. If you have exercise-induced headaches, please make sure that you drink plenty of fluid before and after exercising and that you do not over do it and do not overheat.  Please follow-up routinely to see one of our nurse practitioners in 3 months.

## 2021-04-11 ENCOUNTER — Ambulatory Visit: Payer: Medicare Other | Admitting: Medical

## 2021-04-16 ENCOUNTER — Encounter: Payer: Self-pay | Admitting: Medical

## 2021-04-16 ENCOUNTER — Other Ambulatory Visit: Payer: Self-pay

## 2021-04-16 ENCOUNTER — Ambulatory Visit (INDEPENDENT_AMBULATORY_CARE_PROVIDER_SITE_OTHER): Payer: Medicare Other | Admitting: Medical

## 2021-04-16 VITALS — BP 119/69 | HR 74 | Resp 18 | Ht 61.0 in | Wt 149.6 lb

## 2021-04-16 DIAGNOSIS — E785 Hyperlipidemia, unspecified: Secondary | ICD-10-CM | POA: Diagnosis not present

## 2021-04-16 DIAGNOSIS — R748 Abnormal levels of other serum enzymes: Secondary | ICD-10-CM

## 2021-04-16 DIAGNOSIS — R739 Hyperglycemia, unspecified: Secondary | ICD-10-CM | POA: Diagnosis not present

## 2021-04-16 DIAGNOSIS — R1011 Right upper quadrant pain: Secondary | ICD-10-CM

## 2021-04-16 DIAGNOSIS — H9313 Tinnitus, bilateral: Secondary | ICD-10-CM

## 2021-04-16 LAB — COMPREHENSIVE METABOLIC PANEL
ALT: 10 U/L (ref 0–35)
AST: 16 U/L (ref 0–37)
Albumin: 4.3 g/dL (ref 3.5–5.2)
Alkaline Phosphatase: 112 U/L (ref 39–117)
BUN: 14 mg/dL (ref 6–23)
CO2: 30 mEq/L (ref 19–32)
Calcium: 9.2 mg/dL (ref 8.4–10.5)
Chloride: 107 mEq/L (ref 96–112)
Creatinine, Ser: 0.64 mg/dL (ref 0.40–1.20)
GFR: 88.33 mL/min (ref >60.00)
Glucose, Bld: 97 mg/dL (ref 70–99)
Potassium: 4.7 mEq/L (ref 3.5–5.1)
Sodium: 141 mEq/L (ref 135–145)
Total Bilirubin: 0.5 mg/dL (ref 0.2–1.2)
Total Protein: 7.2 g/dL (ref 6.0–8.3)

## 2021-04-16 LAB — CBC WITH DIFFERENTIAL/PLATELET
Basophils Absolute: 0 10*3/uL (ref 0.0–0.1)
Basophils Relative: 0.6 % (ref 0.0–3.0)
Eosinophils Absolute: 0.1 10*3/uL (ref 0.0–0.7)
Eosinophils Relative: 2.7 % (ref 0.0–5.0)
HCT: 40.6 % (ref 36.0–46.0)
Hemoglobin: 13.6 g/dL (ref 12.0–15.0)
Lymphocytes Relative: 27.9 % (ref 12.0–46.0)
Lymphs Abs: 1.4 10*3/uL (ref 0.7–4.0)
MCHC: 33.5 g/dL (ref 30.0–36.0)
MCV: 94.2 fl (ref 78.0–100.0)
Monocytes Absolute: 0.4 10*3/uL (ref 0.1–1.0)
Monocytes Relative: 8 % (ref 3.0–12.0)
Neutro Abs: 3.1 10*3/uL (ref 1.4–7.7)
Neutrophils Relative %: 60.8 % (ref 43.0–77.0)
Platelets: 206 10*3/uL (ref 150.0–400.0)
RBC: 4.31 Mil/uL (ref 3.87–5.11)
RDW: 13.3 % (ref 11.5–15.5)
WBC: 5.2 10*3/uL (ref 4.0–10.5)

## 2021-04-16 LAB — LIPASE: Lipase: 30 U/L (ref 11.0–59.0)

## 2021-04-16 MED ORDER — ATORVASTATIN CALCIUM 10 MG PO TABS: 10.0000 mg | ORAL_TABLET | Freq: Every day | ORAL | 3 refills | Status: DC

## 2021-04-16 NOTE — Patient Instructions (Addendum)
For high cholesterol rx atorvastatin due to risk score. Rx advisement given.  For elevated sugar advise low sugar diet. 3 months ago 3 month sugar average not in diabetic range.  For faint rt upper quadrant pain transient with one lft elevation will get cmp, cbc and lipase. If lft elevated will order [place] rt upper quadrant.  For tension ha can use flexeril on occasion. Let me know if you need refill. But if rx in future use only at night.  For mild rare tinnitus want you to review education information printed. On review good to know your mri was negative.  Follow up in 3 months. On follow up will get fasting lipid panel.   Tinnitus El trmino tinnitus hace referencia a la percepcin de un sonido que no se corresponde con ninguna fuente real para ese sonido. A menudo se lo describe como zumbido de odos. Sin embargo, las personas que sufren esta afeccin pueden percibir diferentes ruidos, en uno o ambos odos. Los sonidos del tinnitus pueden ser [place], fuertes o de intensidad intermedia. El tinnitus puede prolongarse pocos segundos o ser constante durante [address]. Puede desaparecer sin tratamiento y regresar en distintos momentos. Cuando el tinnitus es permanente u ocurre con frecuencia, puede causar otros problemas, por ejemplo, dificultad para dormir y para concentrarse. Casi todas las Environmental health practitioner tinnitus en algn momento. El tinnitus no es lo mismo que la prdida [place]. El tinnitus a Air cabin crew (crnico) o que regresa con frecuencia (recurrente) es un problema que puede requerir Psychologist, prison and probation services. Cules son las causas? A menudo se desconoce la causa del tinnitus. En algunos casos, puede ser consecuencia de lo siguiente: Exposicin a ruidos fuertes de maquinarias, [place] u otras fuentes. Un objeto (cuerpo extrao) atascado en el odo. Acumulacin de cerumen. El consumo de alcohol o cafena. Tomar ciertos medicamentos. La prdida [place] relacionada con la edad. Tambin puede  ser causada por afecciones mdicas tales como: Infecciones de los odos o de los senos paranasales. Enfermedades cardacas o hipertensin arterial. Alergias. Enfermedad de [place]. Problemas de tiroides. Tumores. Un vaso sanguneo dbil o abultado (aneurisma) cerca del odo. Qu incrementa el riesgo? Los siguientes factores pueden hacer que sea ms propenso a Clinical cytogeneticist afeccin: Exposicin a ruidos fuertes. La edad. Es ms probable que el tinnitus se presente en personas de edad avanzada. Consumo de alcohol o tabaco. Cules son los signos o sntomas? El principal sntoma de tinnitus es la percepcin de un sonido que no se corresponde con ninguna fuente, no proviene de Dealer. El sonido puede percibirse como lo siguiente: Vibracin. Chisporroteo. Zumbido. Soplido de aire. Sibilancia. Silbido. Otros sonidos pueden ser: Crepitacin. Una corriente de agua. Una nota musical. Golpecitos. Runrn. Es posible que los sntomas afecten un solo odo (unilateral) o ambos odos (bilateral). Cmo se diagnostica? El tinnitus se diagnostica en funcin de los sntomas, antecedentes mdicos y un examen fsico. El mdico puede realizar una prueba auditiva exhaustiva (examen de audicin) si el tinnitus: Es unilateral. Causa dificultades [phone]. Dura ms de 6 meses. Usted puede trabajar con un mdico especialista en trastornos [employer] (audilogo). Le pueden hacer preguntas sobre sus sntomas y cmo estos afectan su vida diaria. Es posible que le hagan algunas pruebas, por ejemplo: Exploracin por tomografa computarizada (TC). Resonancia magntica (RM). Un estudio de diagnstico por imgenes de cmo fluye la sangre a travs de los vasos sanguneos (angiograma). Cmo se trata? A veces, el tratamiento de una enfermedad preexistente hace que el tinnitus desaparezca. Si el tinnitus contina, Psychiatric nurse  alguno de los siguientes tratamientos, entre otros: Terapia y  orientacin para ayudarlo a Chief Operating Officer el estrs que significa vivir con tinnitus. Generadores de sonido para enmascarar el tinnitus. Esto incluye lo siguiente: Aparatos de sonido de mesa que reproducen sonidos relajantes para ayudarlo a dormir. Dispositivos inteligentes que se adaptan al odo y reproducen sonidos o [place]. Estimulacin Patent examiner usar auriculares para escuchar msica que contiene Librarian, academic. Con el tiempo, escuchar esta seal puede modificar las redes del cerebro y reducir la sensibilidad al tinnitus. Este tratamiento se [place] en casos muy graves cuando ningn otro tratamiento resulta eficaz. El uso de audfonos o implantes cocleares, si el tinnitus guarda relacin con la prdida de la audicin. Los audfonos se usan en el odo externo. Implantes cocleares se colocan quirrgicamente en el odo interno. Siga estas instrucciones en su casa: Controlar los sntomas   Cuando sea posible, no permanezca en lugares ruidosos y no se exponga a sonidos fuertes. Use dispositivos de proteccin de la audicin, por ejemplo, tapones, cuando est expuesto a ruidos fuertes. Use un aparato de sonido de fondo, un humidificador u otros dispositivos para enmascarar el sonido del tinnitus. [place] en prctica tcnicas para reducir el estrs, como meditacin, yoga o respiracin profunda. Trabaje con el mdico si necesita ayuda para Dealer. Duerma con la cabeza levemente elevada. Esto puede reducir el impacto del tinnitus. Instrucciones generales No use sustancias estimulantes, como nicotina, alcohol o cafena. Hable con el mdico sobre otros estimulantes que Water engineer. Los estimulantes son sustancias que pueden hacer que se sienta alerta y atento al aumentar determinadas actividades en el cuerpo (por ejemplo, la frecuencia cardaca y la presin arterial). Estas sustancias pueden empeorar el tinnitus. Use los medicamentos de venta libre y los recetados solamente como se  lo haya indicado el mdico. Intente dormir mucho todas las noches. Concurra a todas las visitas de seguimiento. Esto es importante. Comunquese con un mdico si: El tinnitus se prolonga durante 3 semanas o ms tiempo y no se detiene. Pierde la audicin de forma repentina. Los sntomas empeoran o no mejoran con los Medical laboratory scientific officer. Siente que no puede controlar el estrs que le provoca vivir con tinnitus. Solicite ayuda de inmediato si: Experimenta tinnitus despus de sufrir una lesin en la cabeza. Tiene tinnitus junto con alguno de estos sntomas: Mareos. Nuseas y vmitos. Prdida del equilibrio. Dolor de cabeza repentino e intenso. Cambios en la visin. Debilidad facial o debilidad de brazos o piernas. Estos sntomas pueden representar un problema grave que constituye Radio broadcast assistant. No espere a ver si los sntomas desaparecen. Solicite atencin mdica de inmediato. Comunquese con el servicio de emergencias de su localidad (911 en los [place]). No conduzca por sus propios medios [facility]. Resumen El trmino tinnitus hace referencia a la percepcin de un sonido que no se corresponde con ninguna fuente real para ese sonido. A menudo se lo describe como zumbido de odos. Es posible que los sntomas afecten un solo odo (unilateral) o ambos odos (bilateral). Use un aparato de sonido de fondo, un humidificador u otros dispositivos para enmascarar el sonido del tinnitus. No use sustancias estimulantes, como nicotina, alcohol o cafena. Estas sustancias pueden empeorar el tinnitus. Esta informacin no tiene Theme park manager el consejo del mdico. Asegrese de hacerle al mdico cualquier pregunta que tenga. Document Revised: 06/10/2020 Document Reviewed: 06/10/2020 Elsevier Patient Education  2022 [employer].

## 2021-04-16 NOTE — Progress Notes (Signed)
Subjective:    Patient ID: Jennefer Bravo, female    DOB: [birth year], 70 y.o.   MRN: [id number]  HPI  Hx of high cholesterol.   Your 10 year cardiovascular risk score is: 9.5%   Values used to calculate the score:     Age: 37 years     Sex: Female     Is Non-Hispanic African American: No     Diabetic: No     Tobacco smoker: No     Systolic Blood Pressure: 117 mmHg     Is BP treated: No     HDL Cholesterol: 89.7 mg/dL     Total Cholesterol: 211 mg/dL   Pt per chart is on fish oil. I offered/recommended atorvastatin on my chart message and today. Pt willing to take low dose atorvastatin.   Pt states some low level rt side abdomen pain. Comes and goes. Mostly just in morning. Not correlated after eating. After morning pain stop. Pain low level and transient.   Hx of elevated sugar and last a1c in  July not in diabetic range.  For tension ha neurologist prescribed flexeril. Pt states did help some.    Pt also briefly mentioned minimal transient on and off tinnitus. 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  MRI of head recently.   IMPRESSION: 1. No acute intracranial abnormality. 2. Normal intracranial MRA. 3. Hyperdense area described on earlier head CT corresponds to the normal right vertebral artery.     Electronically Signed   By: Deatra Robinson M.D.   On: 11/15/2020 20:25    Review of Systems  Constitutional:  Negative for chills, fatigue and fever.  Respiratory:  Negative for cough, chest tightness, shortness of breath and wheezing.   Cardiovascular:  Negative for chest pain and palpitations.  Gastrointestinal:  Positive for abdominal pain. Negative for constipation, nausea and vomiting.       See hpi.  Genitourinary:  Negative for dysuria, enuresis, flank pain, frequency, hematuria and urgency.  Musculoskeletal:  Negative for back pain, myalgias and neck pain.  Skin:  Negative for rash.    No past medical history on file.    Social History   Socioeconomic History   Marital status: Married    Spouse name: Not on file   Number of children: 1   Years of education: Not on file   Highest education level: Not on file  Occupational History   Not on file  Tobacco Use   Smoking status: Never   Smokeless tobacco: Never  Vaping Use   Vaping Use: Never used  Substance and Sexual Activity   Alcohol use: Yes    Comment: rare   Drug use: Never   Sexual activity: Not Currently  Other Topics Concern   Not on file  Social History Narrative   Not on file   Social Determinants of Health   Financial Resource Strain: Not on file  Food Insecurity: Not on file  Transportation Needs: Not on file  Physical Activity: Not on file  Stress: Not on file  Social Connections: Not on file  Intimate Partner Violence: Not on file    Past Surgical History:  Procedure Laterality Date   ABDOMINAL HYSTERECTOMY     CHOLECYSTECTOMY      Family History  Problem Relation Age of Onset   Headache Neg Hx     Not on File  Current Outpatient Medications on File Prior to Visit  Medication Sig Dispense Refill  alendronate (FOSAMAX) 10 MG tablet Take with a full glass of water on an empty stomach. 30 tablet 11   CALCIUM PO Take by mouth daily.     cyclobenzaprine (FLEXERIL) 10 MG tablet Take 1 tablet (10 mg total) by mouth at bedtime as needed for muscle spasms. May take half a pill at bedtime as needed or 1 pill at bedtime as needed. 30 tablet 1   gabapentin (NEURONTIN) 100 MG capsule Take 100 mg by mouth at bedtime.     latanoprost (XALATAN) 0.005 % ophthalmic solution Place 1 drop into both eyes at bedtime.     Omega-3 Fatty Acids (FISH OIL PO) Take by mouth daily.     No current facility-administered medications on file prior to visit.    BP (!) 112/57   Pulse 74   Resp 18   Ht 5\' 1"  (1.549 m)   Wt 149 lb 9.6 oz (67.9 kg)   SpO2 98%   BMI 28.27 kg/m        Objective:   Physical Exam  General- No acute  distress. Pleasant patient. Neck- Full range of motion, no jvd Lungs- Clear, even and unlabored. Heart- regular rate and rhythm. Neurologic- CNII- XII grossly intact.  Abdomen- soft, nt, mild faint rt upper quadrant pain, +bs, no rebound or guarding. No mass or organomegaly     Assessment & Plan:  For high cholesterol rx atrovastatin due to risk score. Rx advisement given.  For elevated sugar advise low sugar diet. 3 months ago 3 month sugar average not in diabetic range.  For faint rt upper quadrant pain transient with one lft elevation will get cmp, cbc and lipase. If lft elevated will order [place] rt upper quadrant.  For tension ha can use flexeril on occasion. Let me know if you need refill. But if rx in future use only at night.  For mild rare tinnitus want you to review education information printed. On review good to know your mri was negative.  Follow up in 3 months. On follow up will get fasting lipid panel.  Esperanza Richters, PA-C

## 2021-06-05 ENCOUNTER — Ambulatory Visit: Payer: Medicare Other | Admitting: Family Medicine

## 2021-07-13 ENCOUNTER — Other Ambulatory Visit: Payer: Self-pay | Admitting: Medical

## 2021-07-17 ENCOUNTER — Encounter: Payer: Self-pay | Admitting: Medical

## 2021-07-17 ENCOUNTER — Ambulatory Visit (INDEPENDENT_AMBULATORY_CARE_PROVIDER_SITE_OTHER): Payer: Medicare Other | Admitting: Medical

## 2021-07-17 VITALS — BP 110/58 | HR 65 | Temp 98.2°F | Resp 18 | Ht 61.0 in | Wt 154.0 lb

## 2021-07-17 DIAGNOSIS — N644 Mastodynia: Secondary | ICD-10-CM

## 2021-07-17 DIAGNOSIS — R739 Hyperglycemia, unspecified: Secondary | ICD-10-CM | POA: Diagnosis not present

## 2021-07-17 DIAGNOSIS — R748 Abnormal levels of other serum enzymes: Secondary | ICD-10-CM

## 2021-07-17 DIAGNOSIS — E785 Hyperlipidemia, unspecified: Secondary | ICD-10-CM

## 2021-07-17 LAB — LIPID PANEL
Cholesterol: 163 mg/dL (ref 0–200)
HDL: 86.9 mg/dL (ref >39.00)
LDL Cholesterol: 54 mg/dL (ref 0–99)
NonHDL: 75.97
Total CHOL/HDL Ratio: 2
Triglycerides: 109 mg/dL (ref 0.0–149.0)
VLDL: 21.8 mg/dL (ref 0.0–40.0)

## 2021-07-17 LAB — COMPREHENSIVE METABOLIC PANEL
ALT: 14 U/L (ref 0–35)
AST: 18 U/L (ref 0–37)
Albumin: 4.2 g/dL (ref 3.5–5.2)
Alkaline Phosphatase: 90 U/L (ref 39–117)
BUN: 11 mg/dL (ref 6–23)
CO2: 30 mEq/L (ref 19–32)
Calcium: 9.2 mg/dL (ref 8.4–10.5)
Chloride: 105 mEq/L (ref 96–112)
Creatinine, Ser: 0.67 mg/dL (ref 0.40–1.20)
GFR: 87.21 mL/min (ref >60.00)
Glucose, Bld: 87 mg/dL (ref 70–99)
Potassium: 4.9 mEq/L (ref 3.5–5.1)
Sodium: 141 mEq/L (ref 135–145)
Total Bilirubin: 0.6 mg/dL (ref 0.2–1.2)
Total Protein: 7.1 g/dL (ref 6.0–8.3)

## 2021-07-17 LAB — HEMOGLOBIN A1C: Hgb A1c MFr Bld: 5.5 % (ref 4.6–6.5)

## 2021-07-17 NOTE — Progress Notes (Signed)
Subjective:    Patient ID: Sara [name], female    DOB: [birth year], 71 y.o.   MRN: [id number]  HPI  Hx of high cholesterol.    Your 10 year cardiovascular risk score is: 9.5%   Values used to calculate the score:     Age: 1 years     Sex: Female     Is Non-Hispanic African American: No     Diabetic: No     Tobacco smoker: No     Systolic Blood Pressure: 117 mmHg     Is BP treated: No     HDL Cholesterol: 89.7 mg/dL     Total Cholesterol: 211 mg/dL    Pt per chart is on fish oil. I offered/recommended atorvastatin on my chart message and today. Pt willing to take low dose atorvastatin. She tells me today no side effects.    Hx of elevated sugar and last a1c in  July not in diabetic range.   Declined flu vaccine. Wiling to get pcv 20.   Pt states up to date on mammogram. Gets done with gynecologist. Pt tells me no significant finding when done in August.  Pt states this week she has faint minimal discomfort.That has occurred twice. States was sharp minimal pain lasting 2 seconds at most. Last week and another time this week. Pt not having chest pain. Pain is around nipple. No cardiac symptoms. This is new problem  Review of Systems  Constitutional:  Negative for chills, fatigue and fever.  Respiratory:  Negative for cough, chest tightness, shortness of breath and wheezing.   Cardiovascular:  Negative for chest pain and palpitations.  Gastrointestinal:  Negative for abdominal pain, constipation, nausea and vomiting.  Genitourinary:  Negative for dysuria, enuresis, flank pain, frequency, hematuria and urgency.  Musculoskeletal:  Negative for back pain, myalgias and neck pain.  Skin:  Negative for rash.    No past medical history on file.   Social History   Socioeconomic History   Marital status: Married    Spouse name: Not on file   Number of children: 1   Years of education: Not on file   Highest education level: Not on file  Occupational History   Not  on file  Tobacco Use   Smoking status: Never   Smokeless tobacco: Never  Vaping Use   Vaping Use: Never used  Substance and Sexual Activity   Alcohol use: Yes    Comment: rare   Drug use: Never   Sexual activity: Not Currently  Other Topics Concern   Not on file  Social History Narrative   Not on file   Social Determinants of Health   Financial Resource Strain: Not on file  Food Insecurity: Not on file  Transportation Needs: Not on file  Physical Activity: Not on file  Stress: Not on file  Social Connections: Not on file  Intimate Partner Violence: Not on file    Past Surgical History:  Procedure Laterality Date   ABDOMINAL HYSTERECTOMY     CHOLECYSTECTOMY      Family History  Problem Relation Age of Onset   Headache Neg Hx     Not on File  Current Outpatient Medications on File Prior to Visit  Medication Sig Dispense Refill   alendronate (FOSAMAX) 10 MG tablet Take with a full glass of water on an empty stomach. 30 tablet 11   atorvastatin (LIPITOR) 10 MG tablet TAKE 1 TABLET BY MOUTH EVERY DAY 90 tablet 1   CALCIUM  PO Take by mouth daily.     cyclobenzaprine (FLEXERIL) 10 MG tablet Take 1 tablet (10 mg total) by mouth at bedtime as needed for muscle spasms. May take half a pill at bedtime as needed or 1 pill at bedtime as needed. 30 tablet 1   gabapentin (NEURONTIN) 100 MG capsule Take 100 mg by mouth at bedtime.     latanoprost (XALATAN) 0.005 % ophthalmic solution Place 1 drop into both eyes at bedtime.     Omega-3 Fatty Acids (FISH OIL PO) Take by mouth daily.     No current facility-administered medications on file prior to visit.    BP (!) 110/58    Pulse 65    Temp 98.2 F (36.8 C)    Resp 18    Ht 5\' 1"  (1.549 m)    Wt 154 lb (69.9 kg)    SpO2 96%    BMI 29.10 kg/m       Objective:   Physical Exam  General- No acute distress. Pleasant patient. Neck- Full range of motion, no jvd Lungs- Clear, even and unlabored. Heart- regular rate and  rhythm. Neurologic- CNII- XII grossly intact.   Breast- symmetric. Normal color. Nipple on both side normal. Left side nipple in question has no dc on palpation. No pain. No redness. No lump or masses felt on either breast. Axillary area normal/no lymph nodes palpated.    Assessment & Plan:   Patient Instructions  For high cholesterol recommend continue low cholesterol diet and atorvastatin. Check cmp and lipid panel today to see if dose adjustment needed.  For elevated sugar will get a1c.  Left side very rare transient nipple pain that is very brief. No abnormal findings on exam today and no pain presently. I would defer to gyn and ask you make appointment with them in a week or two since you are established. Also they have your mammogram and can correlate exam to mammogram.  Follow up date to be determined after lab review. Let me know if having difficulty time getting back in with gyn or if nipple pain recurrent or changing signs/symptoms.   Esperanza Richters, PA-C

## 2021-07-17 NOTE — Patient Instructions (Addendum)
For high cholesterol recommend continue low cholesterol diet and atorvastatin. Check cmp and lipid panel today to see if dose adjustment needed.  For elevated sugar will get a1c.  Left side very rare transient nipple pain that is very brief. No abnormal findings on exam today and no pain presently. I would defer to gyn and ask you make appointment with them in a week or two since you are established. Also they have your mammogram and can correlate exam to mammogram.  Follow up date to be determined after lab review. Let me know if having difficulty time getting back in with gyn or if nipple pain recurrent or changing signs/symptoms.

## 2021-07-24 ENCOUNTER — Telehealth: Payer: Self-pay | Admitting: Medical

## 2021-07-24 NOTE — Telephone Encounter (Signed)
Pt dropped off document for provider to see and have on pt's chart (2 pages of [facility] mamm[facility] - results) Document put at front office tray under providers name.

## 2021-07-24 NOTE — Telephone Encounter (Signed)
Labs placed in red folder

## 2021-08-13 ENCOUNTER — Emergency Department (HOSPITAL_BASED_OUTPATIENT_CLINIC_OR_DEPARTMENT_OTHER)
Admission: EM | Admit: 2021-08-13 | Discharge: 2021-08-13 | Disposition: A | Discharge status: Home / Self Care | Payer: Medicare Other | Attending: Emergency Medicine | Admitting: Emergency Medicine

## 2021-08-13 ENCOUNTER — Telehealth: Payer: Self-pay

## 2021-08-13 ENCOUNTER — Encounter (HOSPITAL_BASED_OUTPATIENT_CLINIC_OR_DEPARTMENT_OTHER): Payer: Self-pay

## 2021-08-13 ENCOUNTER — Emergency Department (HOSPITAL_BASED_OUTPATIENT_CLINIC_OR_DEPARTMENT_OTHER): Payer: Medicare Other

## 2021-08-13 ENCOUNTER — Other Ambulatory Visit: Payer: Self-pay

## 2021-08-13 DIAGNOSIS — R519 Headache, unspecified: Secondary | ICD-10-CM | POA: Diagnosis not present

## 2021-08-13 DIAGNOSIS — R079 Chest pain, unspecified: Secondary | ICD-10-CM | POA: Insufficient documentation

## 2021-08-13 DIAGNOSIS — G44209 Tension-type headache, unspecified, not intractable: Secondary | ICD-10-CM

## 2021-08-13 DIAGNOSIS — M79602 Pain in left arm: Secondary | ICD-10-CM | POA: Diagnosis not present

## 2021-08-13 DIAGNOSIS — R0789 Other chest pain: Secondary | ICD-10-CM

## 2021-08-13 LAB — COMPREHENSIVE METABOLIC PANEL
ALT: 15 U/L (ref 0–44)
AST: 20 U/L (ref 15–41)
Albumin: 3.8 g/dL (ref 3.5–5.0)
Alkaline Phosphatase: 93 U/L (ref 38–126)
Anion gap: 7 (ref 5–15)
BUN: 13 mg/dL (ref 8–23)
CO2: 24 mmol/L (ref 22–32)
Calcium: 9 mg/dL (ref 8.9–10.3)
Chloride: 106 mmol/L (ref 98–111)
Creatinine, Ser: 0.63 mg/dL (ref 0.44–1.00)
GFR, Estimated: >60 mL/min (ref >60)
Glucose, Bld: 128 mg/dL — ABNORMAL HIGH (ref 70–99)
Potassium: 3.9 mmol/L (ref 3.5–5.1)
Sodium: 137 mmol/L (ref 135–145)
Total Bilirubin: 0.4 mg/dL (ref 0.3–1.2)
Total Protein: 6.7 g/dL (ref 6.5–8.1)

## 2021-08-13 LAB — CBC WITH DIFFERENTIAL/PLATELET
Abs Immature Granulocytes: 0 10*3/uL (ref 0.00–0.07)
Basophils Absolute: 0 10*3/uL (ref 0.0–0.1)
Basophils Relative: 1 %
Eosinophils Absolute: 0.1 10*3/uL (ref 0.0–0.5)
Eosinophils Relative: 3 %
HCT: 36.8 % (ref 36.0–46.0)
Hemoglobin: 12.9 g/dL (ref 12.0–15.0)
Immature Granulocytes: 0 %
Lymphocytes Relative: 50 %
Lymphs Abs: 2.4 10*3/uL (ref 0.7–4.0)
MCH: 32.1 pg (ref 26.0–34.0)
MCHC: 35.1 g/dL (ref 30.0–36.0)
MCV: 91.5 fL (ref 80.0–100.0)
Monocytes Absolute: 0.3 10*3/uL (ref 0.1–1.0)
Monocytes Relative: 7 %
Neutro Abs: 1.9 10*3/uL (ref 1.7–7.7)
Neutrophils Relative %: 39 %
Platelets: 213 10*3/uL (ref 150–400)
RBC: 4.02 MIL/uL (ref 3.87–5.11)
RDW: 12.5 % (ref 11.5–15.5)
WBC: 4.8 10*3/uL (ref 4.0–10.5)
nRBC: 0 % (ref 0.0–0.2)

## 2021-08-13 LAB — TROPONIN I (HIGH SENSITIVITY)
Troponin I (High Sensitivity): 3 ng/L (ref <18)
Troponin I (High Sensitivity): 3 ng/L (ref <18)

## 2021-08-13 IMAGING — DX DG CHEST 2V
2 series · 2 of 2 positions shown · non-contrast
Comparison: None.

CLINICAL DATA: Chest pain

EXAM:
CHEST - 2 VIEW

[chest pa]
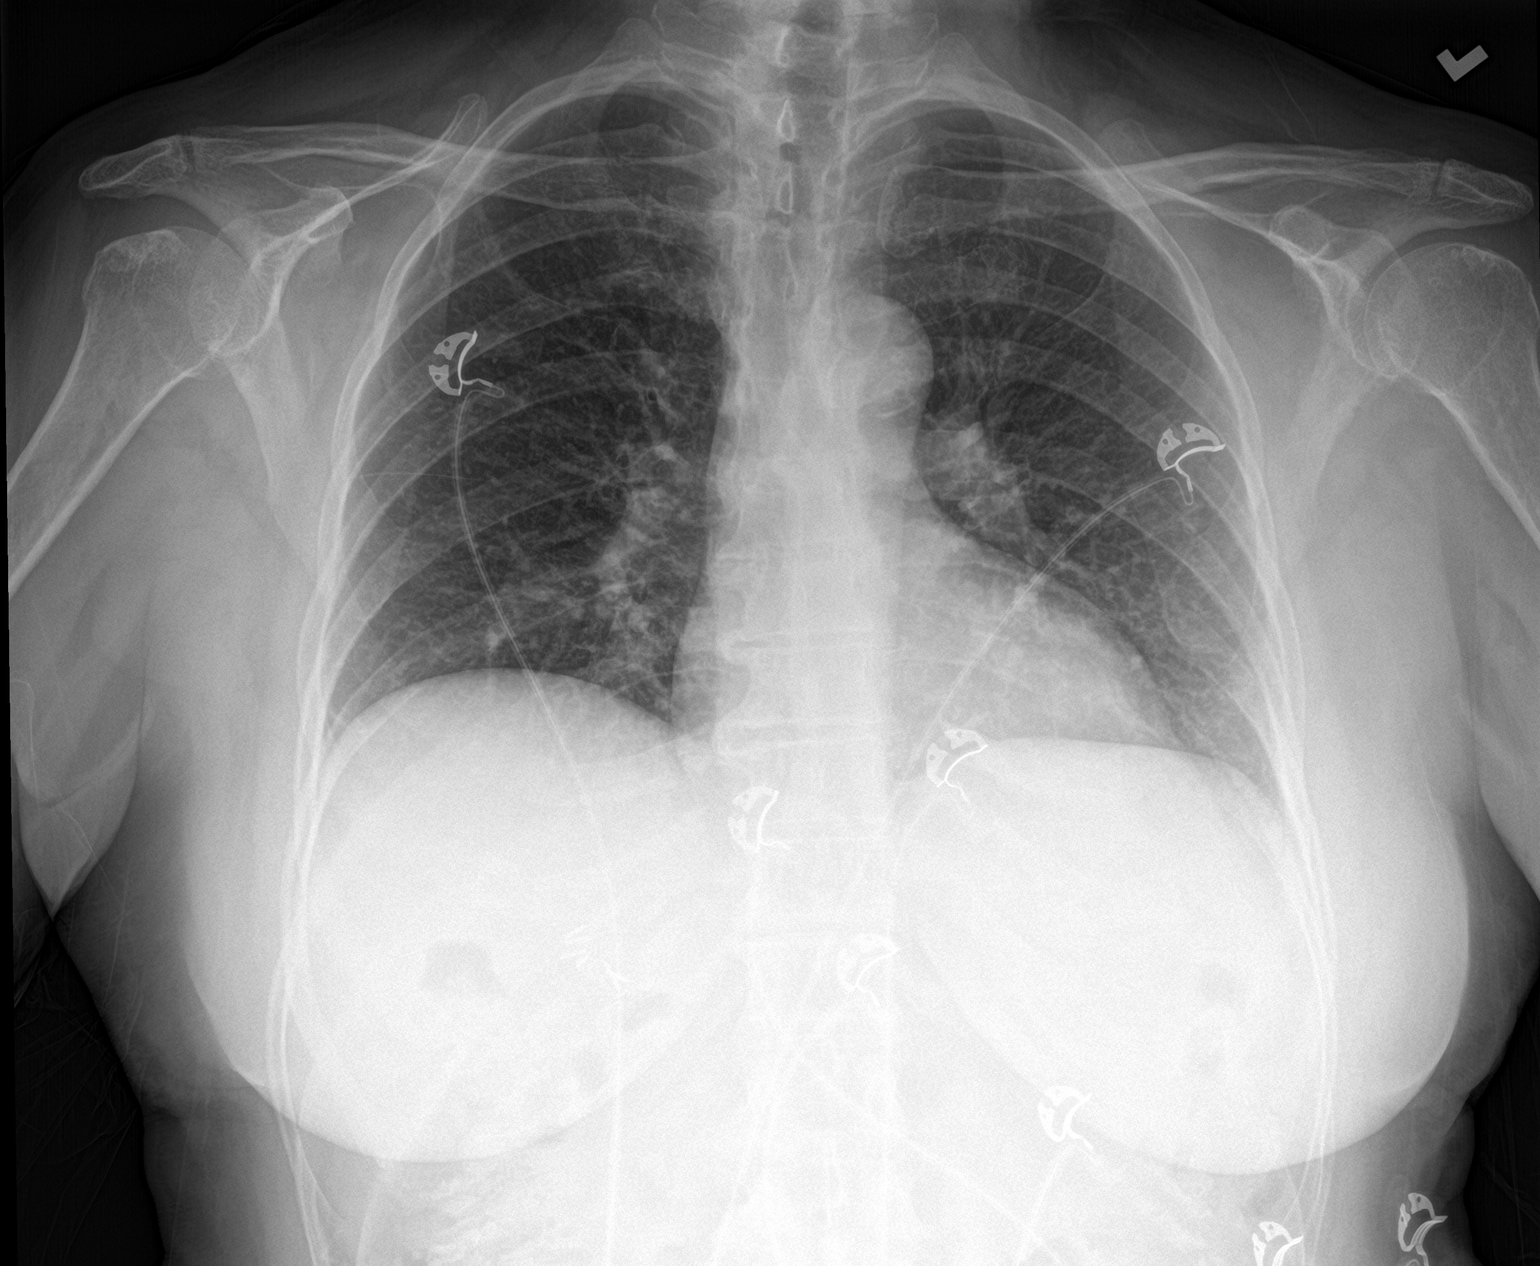

[chest lat]
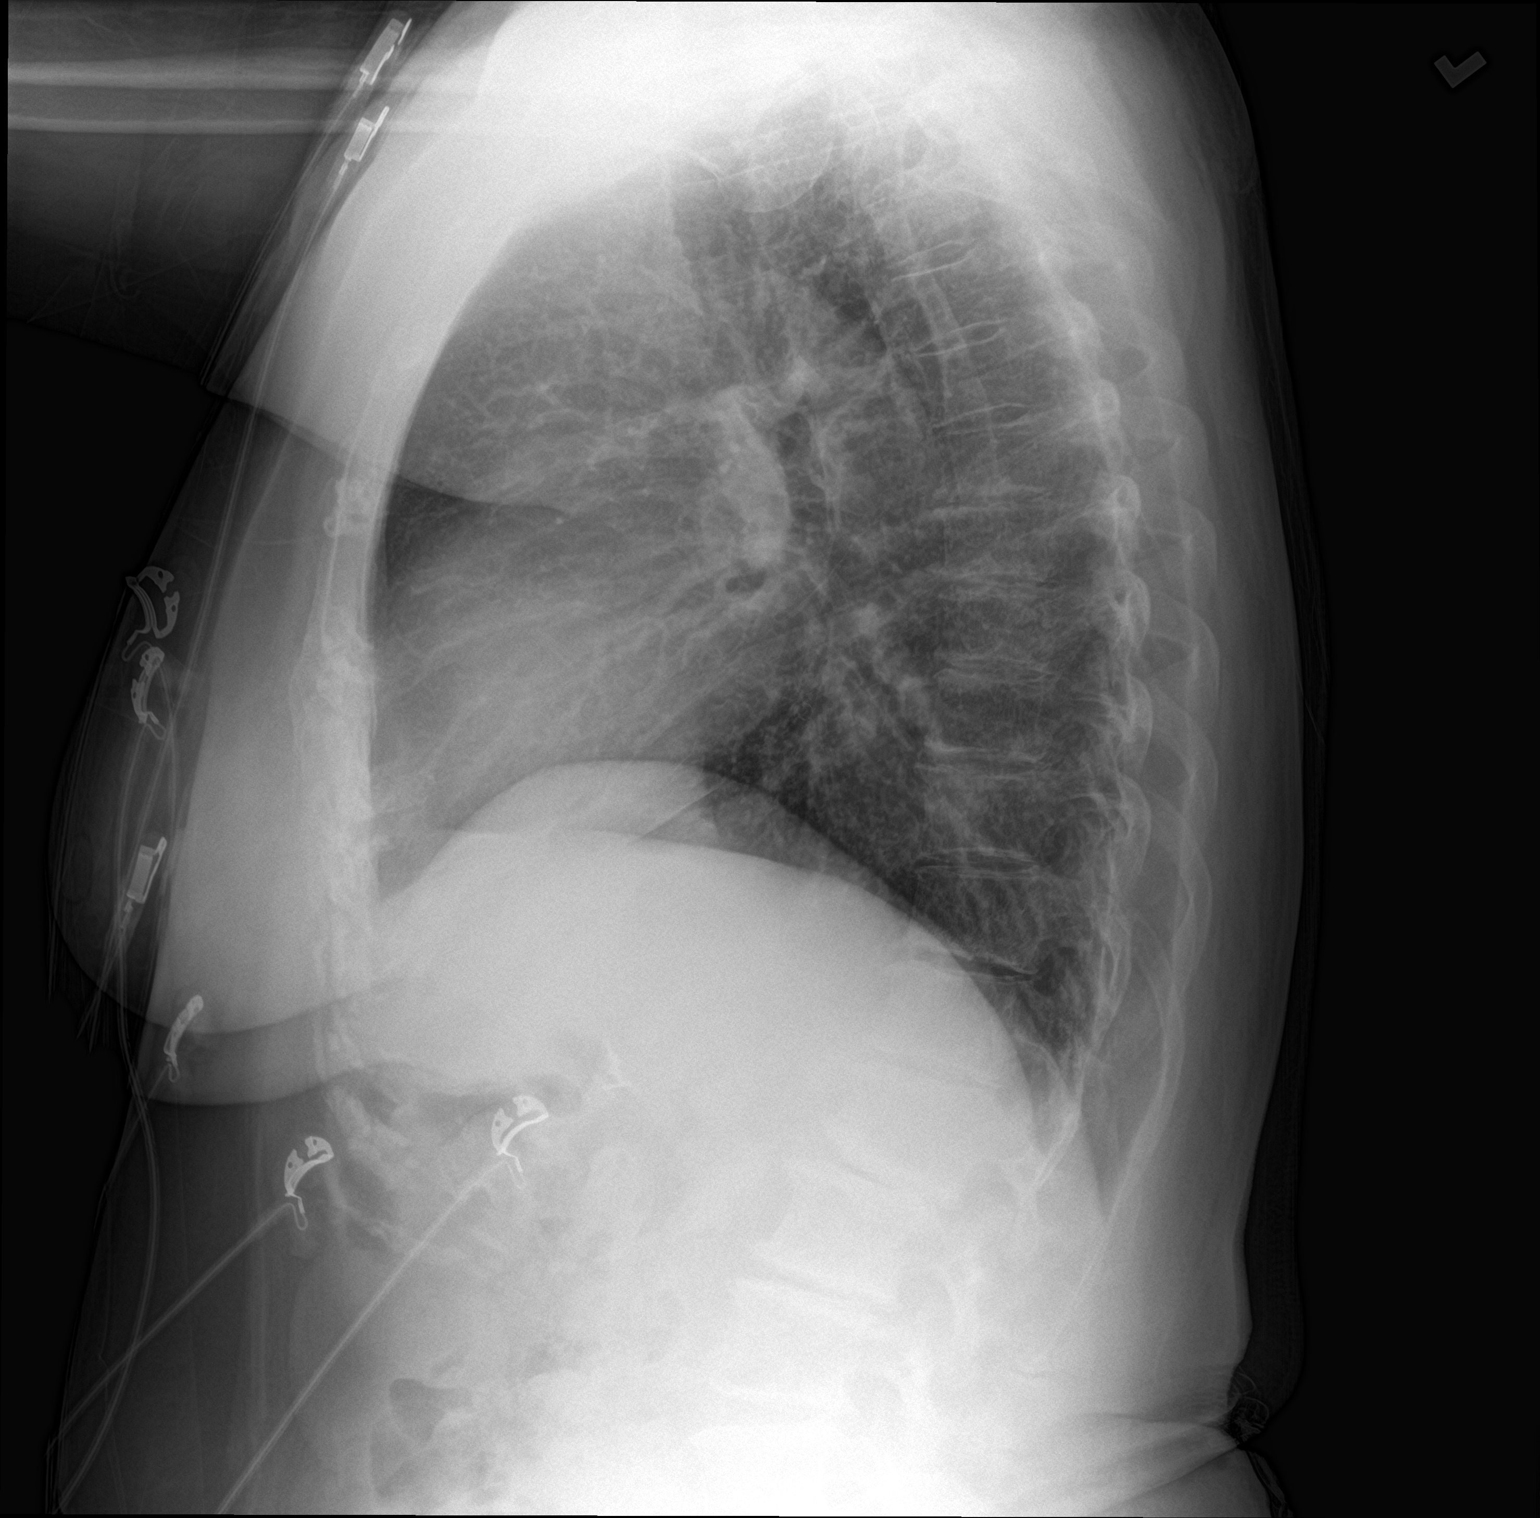

[2 of 2 positions shown; findings below may reference images not displayed]

FINDINGS: The heart size and mediastinal contours are within normal limits.
Both lungs are clear. The visualized skeletal structures are
unremarkable.
IMPRESSION: No active cardiopulmonary disease.

## 2021-08-13 IMAGING — CT CT HEAD W/O CM
4 series · 16 of 47 positions shown, 18 images · non-contrast
Comparison: MRI [DATE], CT [DATE]

CLINICAL DATA: Headache



[Series 2: head wo · axial · 0.41mm/px · z∈[+812,+932]mm · 7 of 32 slices shown, 9 images]
[im 4/32  brain]
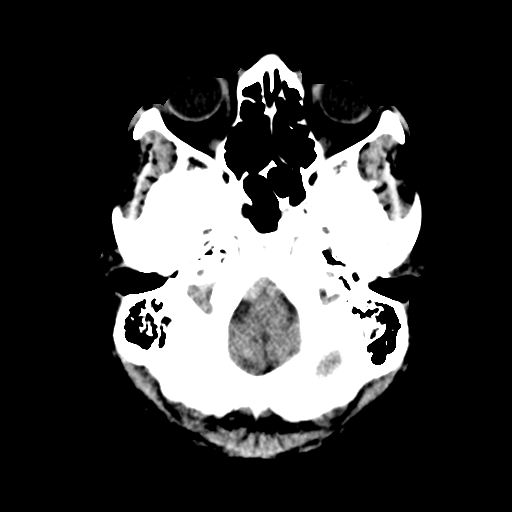
[im 4/32  bone]
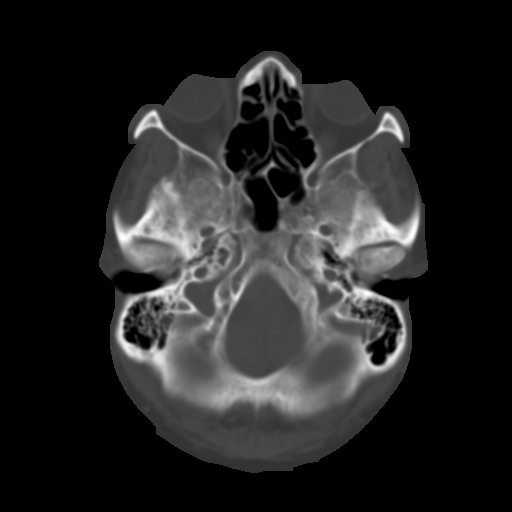
[im 8/32  brain]
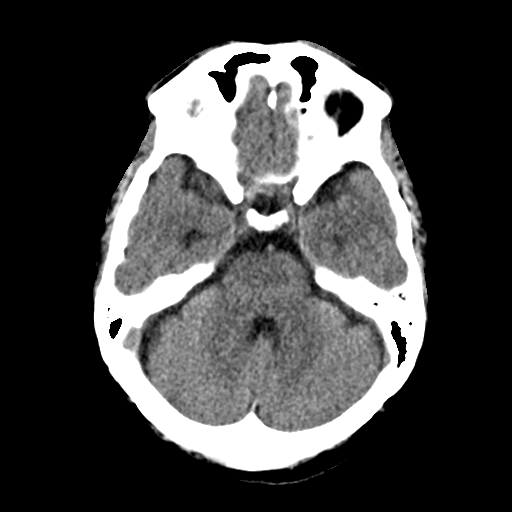
[im 12/32  brain]
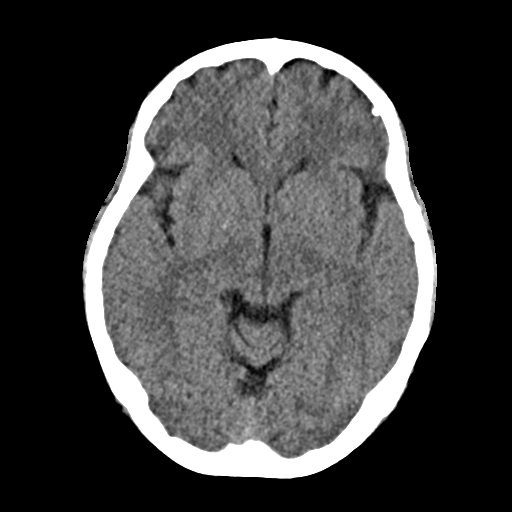
[im 16/32  brain]
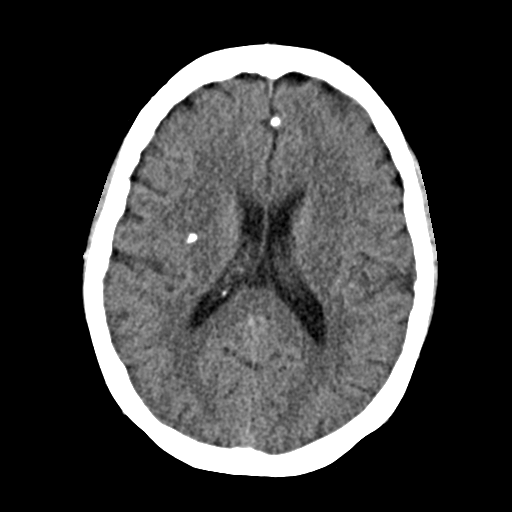
[im 20/32  brain]
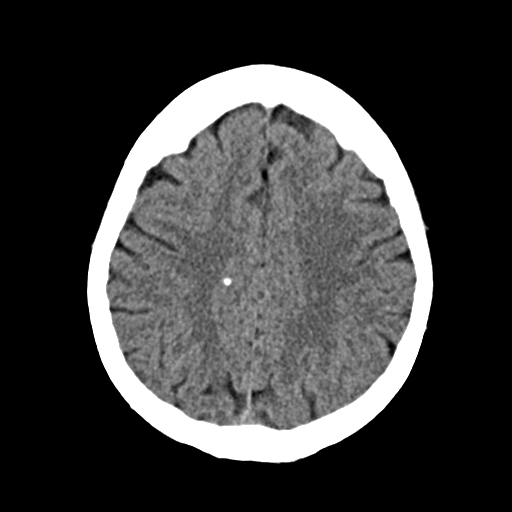
[im 20/32  bone]
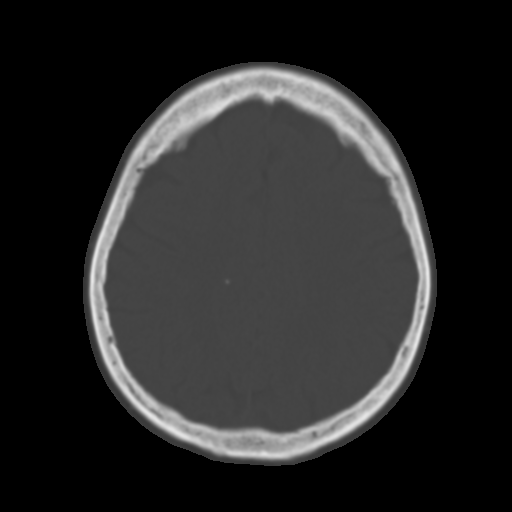
[im 24/32  brain]
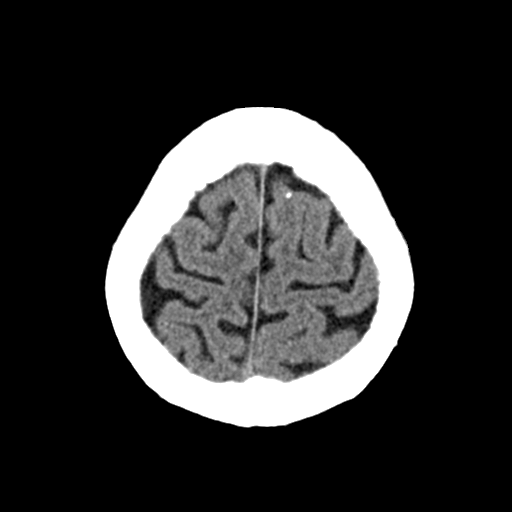
[im 28/32  brain]
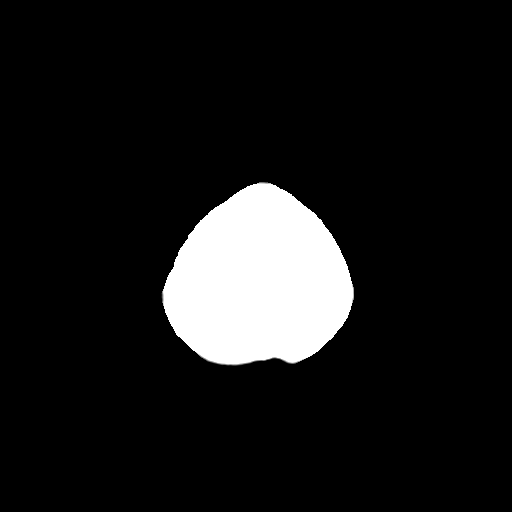

[Series 3: head bone · axial · 0.41mm/px · z∈[+811,+843]mm · 3 of 80 slices shown]
[im 8/80  bone]
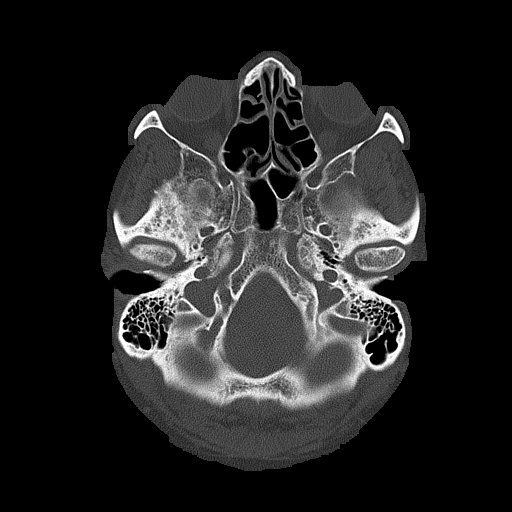
[im 16/80  bone]
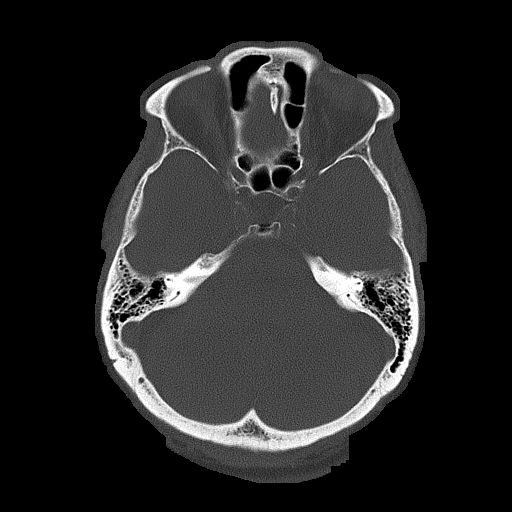
[im 24/80  bone]
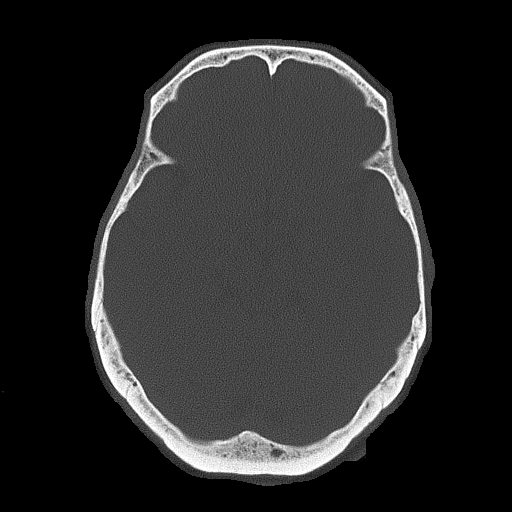

[Series 4: coronal soft · coronal · 0.31mm/px · 3 of 69 slices shown]
[im 23/69  brain]
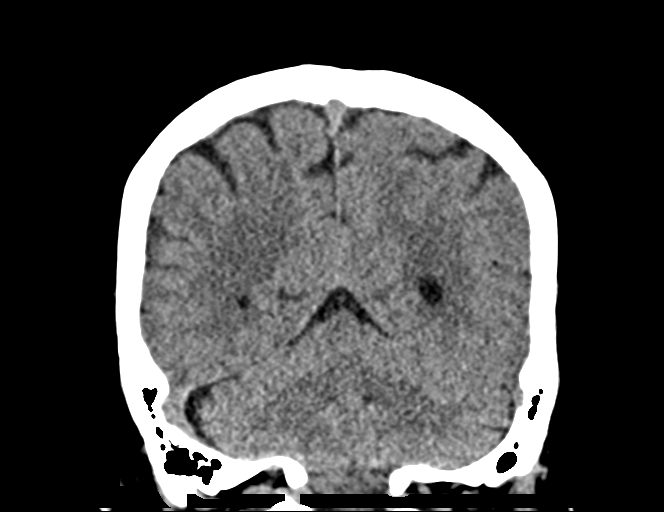
[im 31/69  brain]
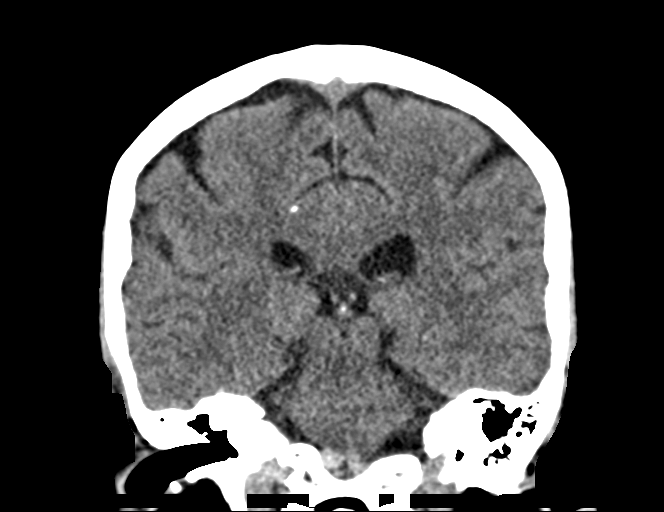
[im 38/69  brain]
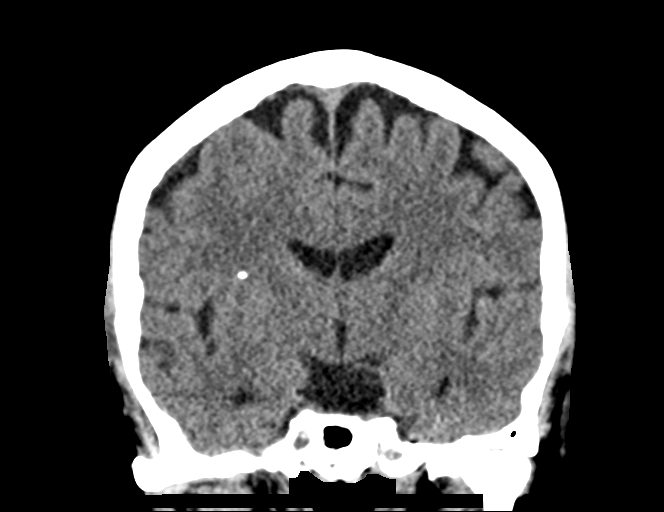

[Series 5: sag soft · sagittal · 0.32mm/px · 3 of 67 slices shown]
[im 23/67  brain]
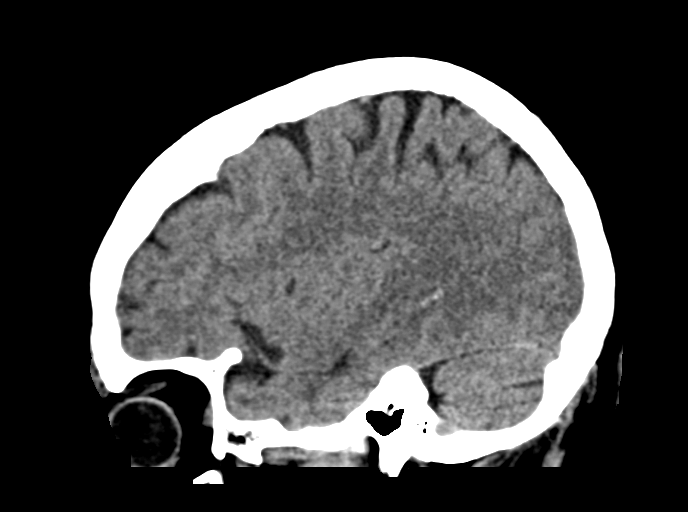
[im 34/67  brain]
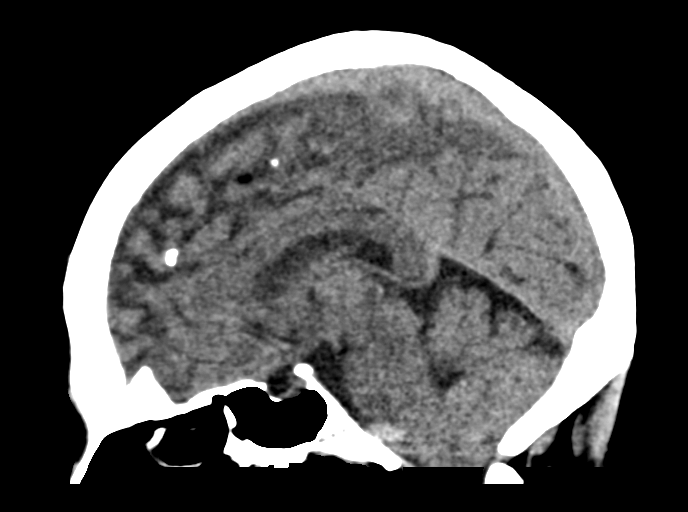
[im 45/67  brain]
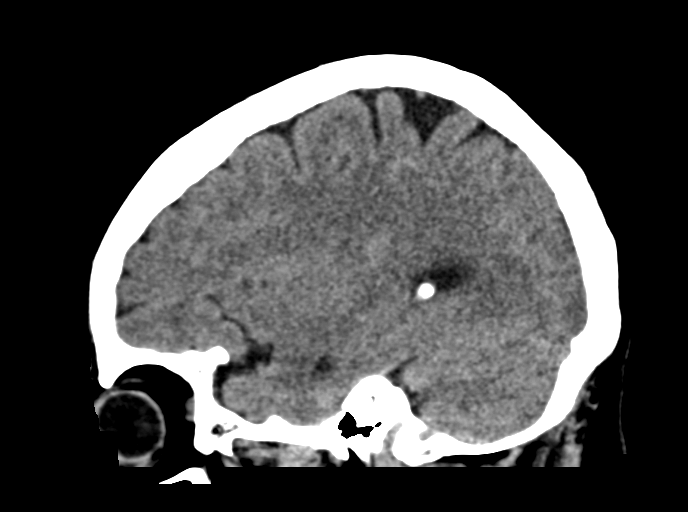

[16 of 47 positions shown; findings below may reference images not displayed]

FINDINGS: Brain: No acute territorial infarction, hemorrhage, or intracranial
mass. Scattered intracranial calcifications likely related to prior
infection. This is unchanged. Nonenlarged ventricles.

Vascular: No hyperdense vessels.  Carotid vascular calcification

Skull: Normal. Negative for fracture or focal lesion.

Sinuses/Orbits: No acute finding.

Other: None
IMPRESSION: 1. No CT evidence for acute intracranial abnormality.

## 2021-08-13 MED ORDER — SODIUM CHLORIDE 0.9 % IV BOLUS
1000.0000 mL | Freq: Once | INTRAVENOUS | Status: AC
  Administered 2021-08-13: 1000 mL via INTRAVENOUS

## 2021-08-13 MED ORDER — METOCLOPRAMIDE HCL 5 MG/ML IJ SOLN
10.0000 mg | Freq: Once | INTRAMUSCULAR | Status: AC
  Administered 2021-08-13: 10 mg via INTRAVENOUS
  Filled 2021-08-13: qty 2

## 2021-08-13 MED ORDER — DIPHENHYDRAMINE HCL 50 MG/ML IJ SOLN
25.0000 mg | Freq: Once | INTRAMUSCULAR | Status: AC
  Administered 2021-08-13: 25 mg via INTRAVENOUS
  Filled 2021-08-13: qty 1

## 2021-08-13 NOTE — Discharge Instructions (Addendum)
Continue taking Motrin for headaches.  Your CT scan and your lab work were unremarkable today.  See your doctor for follow-up.  Consider getting a stress test if you have persistent chest pain  Return to ER if you have worse chest pain, trouble breathing, headaches

## 2021-08-13 NOTE — Telephone Encounter (Signed)
Noted information given to pt.

## 2021-08-13 NOTE — ED Triage Notes (Signed)
Pt c/o CP started yesterday-also c/o HA x 1 week-NAD-steady gait

## 2021-08-13 NOTE — Telephone Encounter (Signed)
Pt has called reporting chest pressure and left arm pressure that one time lasting 5-8 min yesterday. She is stated that she is having recurrent headaches and it has been relieved with Advil. She doesn't want to take too much medication.   I have spoken with the provider and informed the pt to go to ED or urgent care. She stated that she didn't want to go since the last time she was there for over 12 hours and they only gave her one pill.   I have scheduled an appointment with Dr. Drue Novel on Friday at 820 am. Pt reports that if her headache gets worse then she will go to urgent care or ED in [place]. Her current headache is on the top of her head.   I stated understanding. And FYI to provider.

## 2021-08-13 NOTE — ED Provider Notes (Signed)
[name] Provider Note   CSN: [id number] Arrival date & time: 08/13/21  1644     History  Chief Complaint  Patient presents with   Chest Pain    Sara [name] is a 71 y.o. female hx of hyperlipidemia here presenting with chest pain and headache.  Patient states that she had an episode of 8 to 10 minutes of left-sided chest pain.  She is followed left arm pain that lasted several minutes as well.  Patient has some headaches for the last week or so.  She states that she takes some ibuprofen with good relief.  Patient denies weakness or numbness.  Patient has a history of tension headache and follows up with neurology.  Patient called her doctor and was sent here for further evaluation  The history is provided by the patient.      Home Medications Prior to Admission medications   Medication Sig Start Date End Date Taking? Authorizing Provider  alendronate (FOSAMAX) 10 MG tablet Take with a full glass of water on an empty stomach. 01/17/21   Saguier, Percell Miller, PA-C  atorvastatin (LIPITOR) 10 MG tablet TAKE 1 TABLET BY MOUTH EVERY DAY 07/14/21   Saguier, Percell Miller, PA-C  CALCIUM PO Take by mouth daily.    [provider]  cyclobenzaprine (FLEXERIL) 10 MG tablet Take 1 tablet (10 mg total) by mouth at bedtime as needed for muscle spasms. May take half a pill at bedtime as needed or 1 pill at bedtime as needed. 02/10/21   Star Age, MD  gabapentin (NEURONTIN) 100 MG capsule Take 100 mg by mouth at bedtime. 11/10/20   [provider]  latanoprost (XALATAN) 0.005 % ophthalmic solution Place 1 drop into both eyes at bedtime. 01/22/21   [provider]  Omega-3 Fatty Acids (FISH OIL PO) Take by mouth daily.    [provider]      Allergies    Patient has no known allergies.    Review of Systems   Review of Systems  Cardiovascular:  Positive for chest pain.  All other systems reviewed and are negative.  Physical  Exam Updated Vital Signs BP 115/69    Pulse 61    Temp 98.7 F (37.1 C) (Oral)    Resp 16    Ht 5\' 1"  (1.549 m)    Wt 67.1 kg    SpO2 96%    BMI 27.96 kg/m  Physical Exam Vitals and nursing note reviewed.  Constitutional:      Comments: Slightly uncomfortable  HENT:     Head: Normocephalic.  Eyes:     Extraocular Movements: Extraocular movements intact.     Pupils: Pupils are equal, round, and reactive to light.  Cardiovascular:     Rate and Rhythm: Normal rate and regular rhythm.     Heart sounds: Normal heart sounds.  Pulmonary:     Effort: Pulmonary effort is normal.     Breath sounds: Normal breath sounds.  Musculoskeletal:        General: Normal range of motion.     Cervical back: Normal range of motion and neck supple.  Skin:    General: Skin is warm.     Capillary Refill: Capillary refill takes less than 2 seconds.  Neurological:     General: No focal deficit present.     Mental Status: She is alert and oriented to person, place, and time.     Comments: Cranial nerves II to XII intact.  Patient  has no obvious facial droop.  Patient has normal strength and sensation bilateral arms and legs  Psychiatric:        Mood and Affect: Mood normal.        Behavior: Behavior normal.    ED Results / Procedures / Treatments   Labs (all labs ordered are listed, but only abnormal results are displayed) Labs Reviewed  COMPREHENSIVE METABOLIC PANEL - Abnormal; Notable for the following components:      Result Value   Glucose, Bld 128 (*)    All other components within normal limits  CBC WITH DIFFERENTIAL/PLATELET  TROPONIN I (HIGH SENSITIVITY)  TROPONIN I (HIGH SENSITIVITY)    EKG EKG Interpretation  Date/Time:  Wednesday August 13 2021 16:57:38 EST Ventricular Rate:  68 PR Interval:  123 QRS Duration: 97 QT Interval:  423 QTC Calculation: 450 R Axis:   -51 Text Interpretation: Sinus rhythm LAD, consider left anterior fascicular block Consider right ventricular  hypertrophy No significant change since last tracing Confirmed by Wandra Arthurs [phone]) on 08/13/2021 5:19:17 PM  Radiology DG Chest 2 View  Result Date: 08/13/2021 CLINICAL DATA:  Chest pain EXAM: CHEST - 2 VIEW COMPARISON:  None. FINDINGS: The heart size and mediastinal contours are within normal limits. Both lungs are clear. The visualized skeletal structures are unremarkable. IMPRESSION: No active cardiopulmonary disease. Electronically Signed   By: Donavan Foil M.D.   On: 08/13/2021 17:20   CT Head Wo Contrast  Result Date: 08/13/2021 CLINICAL DATA:  Headache EXAM: CT HEAD WITHOUT CONTRAST TECHNIQUE: Contiguous axial images were obtained from the base of the skull through the vertex without intravenous contrast. RADIATION DOSE REDUCTION: This exam was performed according to the departmental dose-optimization program which includes automated exposure control, adjustment of the mA and/or kV according to patient size and/or use of iterative reconstruction technique. COMPARISON:  MRI 11/15/2020, CT 11/15/2020 FINDINGS: Brain: No acute territorial infarction, hemorrhage, or intracranial mass. Scattered intracranial calcifications likely related to prior infection. This is unchanged. Nonenlarged ventricles. Vascular: No hyperdense vessels.  Carotid vascular calcification Skull: Normal. Negative for fracture or focal lesion. Sinuses/Orbits: No acute finding. Other: None IMPRESSION: 1. No CT evidence for acute intracranial abnormality. Electronically Signed   By: Donavan Foil M.D.   On: 08/13/2021 17:26    Procedures Procedures    Medications Ordered in ED Medications  metoCLOPramide (REGLAN) injection 10 mg (10 mg Intravenous Given 08/13/21 1724)  diphenhydrAMINE (BENADRYL) injection 25 mg (25 mg Intravenous Given 08/13/21 1724)  sodium chloride 0.9 % bolus 1,000 mL (1,000 mLs Intravenous New Bag/Given 08/13/21 1728)    ED Course/ Medical Decision Making/ A&P                           Medical  Decision Making [place] is a 71 y.o. female here presenting with chest pain and headache.  Chest pain since yesterday and headache for the last week or so.  Patient has no known CAD.  Her story is atypical for ACS and troponin x2 sufficient to rule out ACS at this point.  I have low suspicion for PE or dissection.  I think headache likely tension headache but given worsening headache for the last week or so, will get a CT head to rule out a bleed.  At this point, will give migraine cocktail and IV fluids and reassess   8:43 PM Troponin negative x2.  CT head unremarkable.  Headache improved with migraine cocktail.  CBC and CMP unremarkable as well.  At this point I think she is stable for discharge.  She can follow-up with cardiology for stress test.  She has history of tension headaches and she can follow-up with her neurologist.  Problems Addressed: Other chest pain: acute illness or injury Tension headache: acute illness or injury  Amount and/or Complexity of Data Reviewed Independent Historian: caregiver External Data Reviewed: notes. Labs: ordered. Decision-making details documented in ED Course. Radiology: ordered and independent interpretation performed. Decision-making details documented in ED Course. ECG/medicine tests: ordered and independent interpretation performed. Decision-making details documented in ED Course.  Risk Prescription drug management.  Final Clinical Impression(s) / ED Diagnoses Final diagnoses:  None    Rx / DC Orders ED Discharge Orders     None         Drenda Freeze, MD 08/13/21 2043

## 2021-08-15 ENCOUNTER — Encounter: Payer: Self-pay | Admitting: Internal Medicine

## 2021-08-15 ENCOUNTER — Encounter (HOSPITAL_COMMUNITY): Payer: Self-pay | Admitting: Internal Medicine

## 2021-08-15 ENCOUNTER — Ambulatory Visit (INDEPENDENT_AMBULATORY_CARE_PROVIDER_SITE_OTHER): Payer: Medicare Other | Admitting: Internal Medicine

## 2021-08-15 VITALS — BP 126/72 | HR 63 | Temp 98.1°F | Resp 18 | Ht 61.0 in | Wt 152.2 lb

## 2021-08-15 DIAGNOSIS — R519 Headache, unspecified: Secondary | ICD-10-CM | POA: Diagnosis not present

## 2021-08-15 DIAGNOSIS — R079 Chest pain, unspecified: Secondary | ICD-10-CM | POA: Diagnosis not present

## 2021-08-15 DIAGNOSIS — F439 Reaction to severe stress, unspecified: Secondary | ICD-10-CM

## 2021-08-15 MED ORDER — TIZANIDINE HCL 2 MG PO TABS: 2.0000 mg | ORAL_TABLET | Freq: Four times a day (QID) | ORAL | 0 refills | Status: DC | PRN

## 2021-08-15 NOTE — Progress Notes (Signed)
Subjective:    Patient ID: Sara [name], female    DOB: Nov 27, 1950, 71 y.o.   MRN: [id number]  DOS:  08/15/2021 Type of visit - description: ER f/u  Went to the ER 2 days ago, CC was chest pain and headache. The patient has chronic headaches, when she describes it, she points to the posterior neck and nuchal area. No associated fever chills Decrease when changing position, for instance lying down.  Also 1 day prior to ER visit had chest pain, left-sided, for 5 minutes associated with some discomfort at the left arm. Symptoms were not exertional. Chest pain has not returned. She still have discomfort for at the left arm which is steady since the ER visit, described that is worse or better depending on certain movements. Currently with no neck pain.   Work-up: CMP, CBC, troponin: All okay. Chest x-ray negative. CT head: No acute EKGs: No acute  Review of Systems Denies fever chills No cough No calf pain or swelling. Last long car trip was 6 weeks ago. When asked, admits to stress.  No past medical history on file.  Past Surgical History:  Procedure Laterality Date   ABDOMINAL HYSTERECTOMY     CHOLECYSTECTOMY      Current Outpatient Medications  Medication Instructions   alendronate (FOSAMAX) 10 MG tablet Take with a full glass of water on an empty stomach.   atorvastatin (LIPITOR) 10 MG tablet TAKE 1 TABLET BY MOUTH EVERY DAY   CALCIUM PO Oral, Daily   gabapentin (NEURONTIN) 100 mg, Oral, Nightly   latanoprost (XALATAN) 0.005 % ophthalmic solution 1 drop, Both Eyes, Daily at bedtime   Omega-3 Fatty Acids (FISH OIL PO) Oral, Daily   tiZANidine (ZANAFLEX) 2 mg, Oral, Every 6 hours PRN       Objective:   Physical Exam BP 126/72 (BP Location: Left Arm, Patient Position: Sitting, Cuff Size: Small)    Pulse 63    Temp 98.1 F (36.7 C) (Oral)    Resp 18    Ht 5\' 1"  (1.549 m)    Wt 152 lb 4 oz (69.1 kg)    SpO2 97%    BMI 28.77 kg/m  General:   Well  developed, NAD, BMI noted. HEENT:  Normocephalic . Face symmetric, atraumatic Neck: Range of motion is normal. Lungs:  CTA B Normal respiratory effort, no intercostal retractions, no accessory muscle use. Heart: RRR,  no murmur.  Lower extremities: no pretibial edema bilaterally  Skin: Not pale. Not jaundice Neurologic:  alert & oriented X3.  Speech normal, gait appropriate for age and unassisted Psych--  Cognition and judgment appear intact.  Cooperative with normal attention span and concentration.  Behavior appropriate. Mildly  anxious but no depressed appearing.      Assessment     71 year old female, PMH includes high cholesterol, osteoporosis, glaucoma, tension headaches (saw neurology 01/2021), here for ER f/u  Chest pain, headache: Presented to the ER with above symptoms, work-up negative, was Rx migraine cocktail and she felt better.  Was released home. Chest pain: Atypical, transient, work-up reassuring, plan: order a Myoview. Headache: Was seen by neurology before, felt to have a tension headache, today she points to the neck and nuchal area when describing the HA, also c/o  left arm pain.  I wonder if there is some cervicogenic component for the HA.  I believe this could be explored further by neurology or PCP in the future.  Neck MRI? Plan: Stop Flexeril, start tizanidine, watch for  somnolence. Recommend to reach out to neurology. Follow-up with PCP in 2 weeks. Stress: reports stress, seem mildly anxious, rec to d/w PCP Printed instructions in English, all discussed in Spanish, she verbalized understanding     This visit occurred during the SARS-CoV-2 public health emergency.  Safety protocols were in place, including screening questions prior to the visit, additional usage of staff PPE, and extensive cleaning of exam room while observing appropriate contact time as indicated for disinfecting solutions.

## 2021-08-15 NOTE — Patient Instructions (Addendum)
Stop Flexeril  Start tizanidine 2 mg: 1 tablet every 6 hours as needed for headache. Tylenol  500 mg OTC 2 tabs a day every 8 hours as needed for pain  We will schedule stress test to check your heart  Please call neurology for reassessment  Come back and see your primary doctor in 2 weeks.  If you have severe chest pain, severe headache: Call or go to the ER

## 2021-08-18 ENCOUNTER — Ambulatory Visit: Payer: Medicare Other | Admitting: Medical

## 2021-08-18 ENCOUNTER — Telehealth (HOSPITAL_COMMUNITY): Payer: Self-pay

## 2021-08-18 NOTE — Telephone Encounter (Signed)
Spoke with the patient, detailed instructions given. She stated that she would be here for her test. Asked to call back with any questions. S.Ottie Neglia EMTP 

## 2021-08-19 ENCOUNTER — Ambulatory Visit (HOSPITAL_COMMUNITY): Payer: Medicare Other | Attending: Cardiology

## 2021-08-19 ENCOUNTER — Other Ambulatory Visit: Payer: Self-pay

## 2021-08-19 DIAGNOSIS — R079 Chest pain, unspecified: Secondary | ICD-10-CM | POA: Diagnosis not present

## 2021-08-19 LAB — MYOCARDIAL PERFUSION IMAGING
LV dias vol: 61 mL (ref 46–106)
LV sys vol: 19 mL
Nuc Stress EF: 69 %
Peak HR: 71 {beats}/min
Rest HR: 65 {beats}/min
Rest Nuclear Isotope Dose: 9.8 mCi
SDS: 1
SRS: 0
SSS: 1
ST Depression (mm): 0 mm
Stress Nuclear Isotope Dose: 31.5 mCi
TID: 1.07

## 2021-08-19 MED ORDER — REGADENOSON 0.4 MG/5ML IV SOLN
0.4000 mg | Freq: Once | INTRAVENOUS | Status: AC
  Administered 2021-08-19: 0.4 mg via INTRAVENOUS

## 2021-08-19 MED ORDER — TECHNETIUM TC 99M TETROFOSMIN IV KIT
31.5000 | PACK | Freq: Once | INTRAVENOUS | Status: AC | PRN
  Administered 2021-08-19: 31.5 via INTRAVENOUS
  Filled 2021-08-19: qty 32

## 2021-08-19 MED ORDER — TECHNETIUM TC 99M TETROFOSMIN IV KIT
9.8000 | PACK | Freq: Once | INTRAVENOUS | Status: AC | PRN
  Administered 2021-08-19: 9.8 via INTRAVENOUS
  Filled 2021-08-19: qty 10

## 2021-08-29 ENCOUNTER — Ambulatory Visit (HOSPITAL_BASED_OUTPATIENT_CLINIC_OR_DEPARTMENT_OTHER)
Admission: RE | Admit: 2021-08-29 | Discharge: 2021-08-29 | Disposition: A | Discharge status: Home / Self Care | Payer: Medicare Other | Source: Ambulatory Visit | Attending: Medical | Admitting: Medical

## 2021-08-29 ENCOUNTER — Other Ambulatory Visit: Payer: Self-pay

## 2021-08-29 ENCOUNTER — Ambulatory Visit (INDEPENDENT_AMBULATORY_CARE_PROVIDER_SITE_OTHER): Payer: Medicare Other | Admitting: Medical

## 2021-08-29 VITALS — BP 118/60 | HR 60 | Temp 98.0°F | Resp 18 | Ht 61.0 in | Wt 152.4 lb

## 2021-08-29 DIAGNOSIS — F419 Anxiety disorder, unspecified: Secondary | ICD-10-CM | POA: Diagnosis not present

## 2021-08-29 DIAGNOSIS — R519 Headache, unspecified: Secondary | ICD-10-CM

## 2021-08-29 DIAGNOSIS — R079 Chest pain, unspecified: Secondary | ICD-10-CM

## 2021-08-29 DIAGNOSIS — M542 Cervicalgia: Secondary | ICD-10-CM

## 2021-08-29 DIAGNOSIS — F329 Major depressive disorder, single episode, unspecified: Secondary | ICD-10-CM

## 2021-08-29 MED ORDER — TIZANIDINE HCL 2 MG PO TABS: 2.0000 mg | ORAL_TABLET | Freq: Four times a day (QID) | ORAL | 0 refills | Status: DC | PRN

## 2021-08-29 NOTE — Progress Notes (Signed)
? ?Subjective:  ? ? Patient ID: Sara [name], female    DOB: 02/02/1951, 71 y.o.   MRN: [id number] ? ?HPI ?Pt in today with some recent headache.  ? ?Pt states saw Dr. Drue Novel and he recommend use tizanidine 1 tab every 6  hours and tylenol. See last note since then ha has resolved. Pt states has stress.  ? ?Dr. Drue Novel hpi from last visit. ? ?"Chest pain, headache: ?Presented to the ER with above symptoms, work-up negative, was Rx migraine cocktail and she felt better.  Was released home. ?Chest pain: Atypical, transient, work-up reassuring, plan: order a Myoview. ?Headache: Was seen by neurology before, felt to have a tension headache, today she points to the neck and nuchal area when describing the HA, also c/o  left arm pain.  I wonder if there is some cervicogenic component for the HA.  I believe this could be explored further by neurology or PCP in the future.  Neck MRI? ?Plan: ?Stop Flexeril, start tizanidine, watch for somnolence. ?Recommend to reach out to neurology. ?Follow-up with PCP in 2 weeks. ?Stress: reports stress, seem mildly anxious, rec to d/w PCP ?Printed instructions in English, all discussed in Spanish, she verbalized understanding" ? ?Pt did not follow advise regarding contacting neurologist for follow up appt.  ?  ?Pt is still having some left trapezius area pain.  ? ?On review ED saw pt for ha and work up negtive. CT head unremarkable. ? ?Pt also states having depression/sad for about one year since living in [place]. Pt lived in [place]. Various family member live there. Pt also states husband wants to move to [place]. Phq-9 10 score. Gad-7 6.  ? ? ?Also pt also summarizes and update me on recent ED visit when she went. Cardiac work up was negative. Since going to ED the chest pain has not returned. ? ? ? ? ?Review of Systems  ?Constitutional:  Negative for chills, fatigue and fever.  ?Respiratory:  Negative for cough, shortness of breath and wheezing.   ?Cardiovascular:  Negative for chest  pain, palpitations and leg swelling.  ?Gastrointestinal:  Negative for abdominal pain.  ?Musculoskeletal:  Positive for neck pain.  ?Skin:  Negative for rash.  ?Neurological:  Negative for dizziness, speech difficulty, weakness, numbness and headaches.  ?Psychiatric/Behavioral:  Positive for dysphoric mood. Negative for suicidal ideas. The patient is nervous/anxious.   ?   ?Objective:  ? Physical Exam ? ?General ?Mental Status- Alert. General Appearance- Not in acute distress.  ? ?Skin ?General: Color- Normal Color. Moisture- Normal Moisture. ? ?Neck ?Carotid Arteries- Normal color. Moisture- Normal Moisture. No carotid bruits. No JVD. Left side trapezius tenderness also left side paracervical tenderness. ? ?Chest and Lung Exam ?Auscultation: ?Breath Sounds:-Normal. ? ?Cardiovascular ?Auscultation:Rythm- Regular. ?Murmurs & Other Heart Sounds:Auscultation of the heart reveals- No Murmurs. ? ?Abdomen ?Inspection:-Inspeection Normal. ?Palpation/Percussion:Note:No mass. Palpation and Percussion of the abdomen reveal- Non Tender, Non Distended + BS, no rebound or guarding. ? ?Neurologic ?Cranial Nerve exam:- CN III-XII intact(No nystagmus), symmetric smile. ?Strength:- 5/5 equal and symmetric strength both upper and lower extremities.  ? ? ?   ?Assessment & Plan:  ? ?Patient Instructions  ?Depression and anxiety. You have indicators of both on phq-9 score and Gad-7. Offered med and declines. Since declined meds gave number to counselor/spanish speaking. ? ?For probable tension ha. Refilling muscle relaxant and can use in combination with tylenol as it seemed to help. Use if recurent ha. If severe ha not responding then ED evaluation.  Also recommend trying to get back in with neurologist. ? ?Decided to get cervical spine xray since some left side neck and trapezius pain as well. ? ?No chest pain since day of  ED visit.(Also no cardiac like associated signs or symptoms). If you have any recurrent  cheat pain recommend ED  evaluation. If does occur again would refer to cardiologist.  ? ?Follow up in 3-4 weeks or sooner if needed.  ?

## 2021-08-29 NOTE — Patient Instructions (Addendum)
Depression and anxiety. You have indicators of both on phq-9 score and Gad-7. Offered med and declines. Since declined meds gave number to counselor/spanish speaking. ? ?For probable tension ha. Refilling muscle relaxant and can use in combination with tylenol as it seemed to help. Use if recurent ha. If severe ha not responding then ED evaluation. Also recommend trying to get back in with neurologist. ? ?Decided to get cervical spine xray since some left side neck and trapezius pain as well. ? ?No chest pain since day of  ED visit.(Also no cardiac like associated signs or symptoms). If you have any recurrent  cheat pain recommend ED evaluation. If does occur again would refer to cardiologist.  ? ?Follow up in 3-4 weeks or sooner if needed. ?

## 2021-09-25 ENCOUNTER — Telehealth: Payer: Self-pay | Admitting: Medical

## 2021-09-25 NOTE — Telephone Encounter (Signed)
Left message for patient to call back and schedule Medicare Annual Wellness Visit (AWV)  ? ?AWVI DUE 03/29/2020 PER PALMETTO please schedule at anytime with health coach ? ?This should be a 45 minute visit.  ? ?I gave my direct number [phone] ?

## 2021-09-29 ENCOUNTER — Ambulatory Visit: Payer: Medicare Other | Admitting: Medical

## 2021-12-02 ENCOUNTER — Ambulatory Visit: Payer: Medicare Other

## 2021-12-05 ENCOUNTER — Other Ambulatory Visit: Payer: Self-pay

## 2021-12-10 ENCOUNTER — Ambulatory Visit (INDEPENDENT_AMBULATORY_CARE_PROVIDER_SITE_OTHER): Payer: Medicare Other

## 2021-12-10 VITALS — BP 128/77 | HR 69 | Temp 98.0°F | Resp 16 | Ht 59.0 in | Wt 151.2 lb

## 2021-12-10 DIAGNOSIS — Z0000 Encounter for general adult medical examination without abnormal findings: Secondary | ICD-10-CM | POA: Diagnosis not present

## 2021-12-10 NOTE — Progress Notes (Addendum)
Subjective:   Sara [name] is a 71 y.o. female who presents for an Initial Medicare Annual Wellness Visit.  Review of Systems     Cardiac Risk Factors include: advanced age (>18men, >46 women)     Objective:    Today's Vitals   12/10/21 1421  BP: 128/77  Pulse: 69  Resp: 16  Temp: 98 F (36.7 C)  SpO2: 95%  Weight: 151 lb 3.2 oz (68.6 kg)  Height: 4\' 11"  (1.499 m)   Body mass index is 30.54 kg/m.     12/10/2021    2:13 PM 08/13/2021    4:53 PM 11/15/2020    2:48 PM  Advanced Directives  Does Patient Have a Medical Advance Directive? No No No  Would patient like information on creating a medical advance directive? No - Patient declined      Current Medications (verified) Outpatient Encounter Medications as of 12/10/2021  Medication Sig   alendronate (FOSAMAX) 10 MG tablet Take with a full glass of water on an empty stomach.   atorvastatin (LIPITOR) 10 MG tablet TAKE 1 TABLET BY MOUTH EVERY DAY   CALCIUM PO Take by mouth daily.   gabapentin (NEURONTIN) 100 MG capsule Take 100 mg by mouth at bedtime.   latanoprost (XALATAN) 0.005 % ophthalmic solution Place 1 drop into both eyes at bedtime.   Omega-3 Fatty Acids (FISH OIL PO) Take by mouth daily.   tiZANidine (ZANAFLEX) 2 MG tablet Take 1 tablet (2 mg total) by mouth every 6 (six) hours as needed for muscle spasms.   No facility-administered encounter medications on file as of 12/10/2021.    Allergies (verified) Patient has no known allergies.   History: No past medical history on file. Past Surgical History:  Procedure Laterality Date   ABDOMINAL HYSTERECTOMY     CHOLECYSTECTOMY     Family History  Problem Relation Age of Onset   Headache Neg Hx    Social History   Socioeconomic History   Marital status: Married    Spouse name: Not on file   Number of children: 1   Years of education: Not on file   Highest education level: Not on file  Occupational History   Not on file  Tobacco Use    Smoking status: Never   Smokeless tobacco: Never  Vaping Use   Vaping Use: Never used  Substance and Sexual Activity   Alcohol use: Yes    Comment: occ   Drug use: Never   Sexual activity: Not Currently  Other Topics Concern   Not on file  Social History Narrative   Not on file   Social Determinants of Health   Financial Resource Strain: Low Risk  (12/10/2021)   Overall Financial Resource Strain (CARDIA)    Difficulty of Paying Living Expenses: Not hard at all  Food Insecurity: No Food Insecurity (12/10/2021)   Hunger Vital Sign    Worried About Running Out of Food in the Last Year: Never true    Ran Out of Food in the Last Year: Never true  Transportation Needs: No Transportation Needs (12/10/2021)   PRAPARE - Administrator, Civil Service (Medical): No    Lack of Transportation (Non-Medical): No  Physical Activity: Inactive (12/10/2021)   Exercise Vital Sign    Days of Exercise per Week: 0 days    Minutes of Exercise per Session: 0 min  Stress: No Stress Concern Present (12/10/2021)   [employer] of Occupational Health - Occupational Stress Questionnaire  Feeling of Stress : Not at all  Social Connections: Moderately Integrated (12/10/2021)   Social Connection and Isolation Panel [NHANES]    Frequency of Communication with Friends and Family: More than three times a week    Frequency of Social Gatherings with Friends and Family: More than three times a week    Attends Religious Services: More than 4 times per year    Active Member of [employer] or Organizations: No    Attends Music therapist: Never    Marital Status: Married    Tobacco Counseling Counseling given: Not Answered   Clinical Intake:  Pre-visit preparation completed: Yes  Pain : No/denies pain     BMI - recorded: 30.54 Nutritional Status: BMI > 30  Obese Nutritional Risks: None Diabetes: No  How often do you need to have someone help you when you read instructions,  pamphlets, or other written materials from your doctor or pharmacy?: 1 - Never  Diabetic?No  Interpreter Needed?: No  Information entered by :: Lundyn Coste   Activities of Daily Living    12/10/2021    2:28 PM 01/09/2021    8:02 AM  In your present state of health, do you have any difficulty performing the following activities:  Hearing? 0 0  Vision? 1 0  Difficulty concentrating or making decisions? 0 0  Walking or climbing stairs? 0 0  Dressing or bathing? 0 0  Doing errands, shopping? 0 0  Preparing Food and eating ? N   Using the Toilet? N   In the past six months, have you accidently leaked urine? N   Do you have problems with loss of bowel control? N   Managing your Medications? N   Managing your Finances? N   Housekeeping or managing your Housekeeping? N     Patient Care Team: Saguier, Iris Pert as PCP - General (Internal Medicine) Drema Dallas, DO as Consulting Physician (Obstetrics and Gynecology)  Indicate any recent Medical Services you may have received from other than [facility] providers in the past year (date may be approximate).     Assessment:   This is a routine wellness examination for Sara [name].  Hearing/Vision screen No results found.  Dietary issues and exercise activities discussed: Current Exercise Habits: The patient does not participate in regular exercise at present, Exercise limited by: None identified   Goals Addressed   None    Depression Screen    12/10/2021    2:16 PM 08/29/2021    9:28 AM 07/17/2021    8:07 AM 01/09/2021    8:02 AM  PHQ 2/9 Scores  PHQ - 2 Score 0 2 0 0  PHQ- 9 Score  10      Fall Risk    07/17/2021    8:07 AM  Fall Risk   Falls in the past year? 0  Number falls in past yr: 0  Injury with Fall? 0  Follow up Falls evaluation completed    [place]:  Any stairs in or around the home? No  If so, are there any without handrails?  N/A Home free of loose throw rugs in  walkways, pet beds, electrical cords, etc? Yes  Adequate lighting in your home to reduce risk of falls? Yes   ASSISTIVE DEVICES UTILIZED TO PREVENT FALLS:  Life alert? No  Use of a cane, walker or w/c? No  Grab bars in the bathroom? No  Shower chair or bench in shower? No  Elevated toilet seat or  a handicapped toilet? No   TIMED UP AND GO:  Was the test performed? Yes .  Length of time to ambulate 10 feet: 9 sec.   Gait steady and fast without use of assistive device  Cognitive Function:        12/10/2021    2:31 PM  6CIT Screen  What Year? 0 points  What month? 0 points  What time? 0 points  Count back from 20 0 points  Months in reverse 0 points  Repeat phrase 10 points  Total Score 10 points    Immunizations Immunization History  Administered Date(s) Administered   PFIZER(Purple Top)SARS-COV-2 Vaccination 10/05/2019, 10/27/2019, 05/19/2020, 10/08/2020    TDAP status: Due, Education has been provided regarding the importance of this vaccine. Advised may receive this vaccine at local pharmacy or Health Dept. Aware to provide a copy of the vaccination record if obtained from local pharmacy or Health Dept. Verbalized acceptance and understanding.  Flu Vaccine status: Due, Education has been provided regarding the importance of this vaccine. Advised may receive this vaccine at local pharmacy or Health Dept. Aware to provide a copy of the vaccination record if obtained from local pharmacy or Health Dept. Verbalized acceptance and understanding.  Pneumococcal vaccine status: Due, Education has been provided regarding the importance of this vaccine. Advised may receive this vaccine at local pharmacy or Health Dept. Aware to provide a copy of the vaccination record if obtained from local pharmacy or Health Dept. Verbalized acceptance and understanding.  Covid-19 vaccine status: Information provided on how to obtain vaccines.   Qualifies for Shingles Vaccine? Yes   Zostavax  completed No   Shingrix Completed?: No.    Education has been provided regarding the importance of this vaccine. Patient has been advised to call insurance company to determine out of pocket expense if they have not yet received this vaccine. Advised may also receive vaccine at local pharmacy or Health Dept. Verbalized acceptance and understanding.  Screening Tests Health Maintenance  Topic Date Due   Hepatitis C Screening  Never done   TETANUS/TDAP  Never done   COLONOSCOPY (Pts 45-38yrs Insurance coverage will need to be confirmed)  Never done   Zoster Vaccines- Shingrix (1 of 2) Never done   Pneumonia Vaccine 28+ Years old (1 - PCV) Never done   COVID-19 Vaccine (5 - Pfizer series) 12/03/2020   INFLUENZA VACCINE  01/27/2022   MAMMOGRAM  03/18/2023   DEXA SCAN  Completed   HPV VACCINES  Aged Out    Health Maintenance  Health Maintenance Due  Topic Date Due   Hepatitis C Screening  Never done   TETANUS/TDAP  Never done   COLONOSCOPY (Pts 45-47yrs Insurance coverage will need to be confirmed)  Never done   Zoster Vaccines- Shingrix (1 of 2) Never done   Pneumonia Vaccine 63+ Years old (1 - PCV) Never done   COVID-19 Vaccine ([place] series) 12/03/2020    Colorectal cancer screening: Referral to GI placed declined. Pt aware the office will call re: appt.  Mammogram status: Completed 03/17/21. Repeat every year  Bone Density status: Completed 01/16/21. Results reflect: Bone density results: OSTEOPOROSIS. Repeat every 2 years.  Lung Cancer Screening: (Low Dose CT Chest recommended if Age 93-80 years, 30 pack-year currently smoking OR have quit w/in 15years.) does not qualify.   Lung Cancer Screening Referral: N/A  Additional Screening:  Hepatitis C Screening: does qualify; Completed N/a  Vision Screening: Recommended annual ophthalmology exams for early detection of glaucoma and  other disorders of the eye. Is the patient up to date with their annual eye exam?  Yes  Who is  the provider or what is the name of the office in which the patient attends annual eye exams? N/A If pt is not established with a provider, would they like to be referred to a provider to establish care? No .   Dental Screening: Recommended annual dental exams for proper oral hygiene  Community Resource Referral / Chronic Care Management: CRR required this visit?  No   CCM required this visit?  No      Plan:     I have personally reviewed and noted the following in the patient's chart:   Medical and social history Use of alcohol, tobacco or illicit drugs  Current medications and supplements including opioid prescriptions. Patient is not currently taking opioid prescriptions. Functional ability and status Nutritional status Physical activity Advanced directives List of other physicians Hospitalizations, surgeries, and ER visits in previous 12 months Vitals Screenings to include cognitive, depression, and falls Referrals and appointments  In addition, I have reviewed and discussed with patient certain preventive protocols, quality metrics, and best practice recommendations. A written personalized care plan for preventive services as well as general preventive health recommendations were provided to patient.     Duard Brady Yzabelle Calles, [place]   12/10/2021   Nurse Notes: none  Review and Agree with assessment & plan of CMA  [employer], PA-C

## 2021-12-10 NOTE — Patient Instructions (Signed)
[name] , Thank you for taking time to come for your Medicare Wellness Visit. I appreciate your ongoing commitment to your health goals. Please review the following plan we discussed and let me know if I can assist you in the future.   Screening recommendations/referrals: Colonoscopy: declined Mammogram: 03/17/21 due 03/18/23 Bone Density: 01/16/21 due 01/17/23 Recommended yearly ophthalmology/optometry visit for glaucoma screening and checkup Recommended yearly dental visit for hygiene and checkup  Vaccinations: Influenza vaccine: Due-May obtain vaccine at our office or your local pharmacy.  Pneumococcal vaccine: Due-May obtain vaccine at our office or your local pharmacy.  Tdap vaccine: Due-May obtain vaccine at our office or your local pharmacy.  Shingles vaccine: Due-May obtain vaccine at our office or your local pharmacy.    Covid-19:Due-May obtain vaccine at your local pharmacy.   Advanced directives: no  Conditions/risks identified: see problem list  Next appointment: Follow up in one year for your annual wellness visit    Preventive Care 65 Years and Older, Female Preventive care refers to lifestyle choices and visits with your health care provider that can promote health and wellness. What does preventive care include? A yearly physical exam. This is also called an annual well check. Dental exams once or twice a year. Routine eye exams. Ask your health care provider how often you should have your eyes checked. Personal lifestyle choices, including: Daily care of your teeth and gums. Regular physical activity. Eating a healthy diet. Avoiding tobacco and drug use. Limiting alcohol use. Practicing safe sex. Taking low-dose aspirin every day. Taking vitamin and mineral supplements as recommended by your health care provider. What happens during an annual well check? The services and screenings done by your health care provider during your annual well check will  depend on your age, overall health, lifestyle risk factors, and family history of disease. Counseling  Your health care provider may ask you questions about your: Alcohol use. Tobacco use. Drug use. Emotional well-being. Home and relationship well-being. Sexual activity. Eating habits. History of falls. Memory and ability to understand (cognition). Work and work Astronomer. Reproductive health. Screening  You may have the following tests or measurements: Height, weight, and BMI. Blood pressure. Lipid and cholesterol levels. These may be checked every 5 years, or more frequently if you are over 25 years old. Skin check. Lung cancer screening. You may have this screening every year starting at age 63 if you have a 30-pack-year history of smoking and currently smoke or have quit within the past 15 years. Fecal occult blood test (FOBT) of the stool. You may have this test every year starting at age 63. Flexible sigmoidoscopy or colonoscopy. You may have a sigmoidoscopy every 5 years or a colonoscopy every 10 years starting at age 30. Hepatitis C blood test. Hepatitis B blood test. Sexually transmitted disease (STD) testing. Diabetes screening. This is done by checking your blood sugar (glucose) after you have not eaten for a while (fasting). You may have this done every 1-3 years. Bone density scan. This is done to screen for osteoporosis. You may have this done starting at age 22. Mammogram. This may be done every 1-2 years. Talk to your health care provider about how often you should have regular mammograms. Talk with your health care provider about your test results, treatment options, and if necessary, the need for more tests. Vaccines  Your health care provider may recommend certain vaccines, such as: Influenza vaccine. This is recommended every year. Tetanus, diphtheria, and acellular pertussis (  Tdap, Td) vaccine. You may need a Td booster every 10 years. Zoster vaccine. You may  need this after age 86. Pneumococcal 13-valent conjugate (PCV13) vaccine. One dose is recommended after age 49. Pneumococcal polysaccharide (PPSV23) vaccine. One dose is recommended after age 21. Talk to your health care provider about which screenings and vaccines you need and how often you need them. This information is not intended to replace advice given to you by your health care provider. Make sure you discuss any questions you have with your health care provider. Document Released: 07/12/2015 Document Revised: 03/04/2016 Document Reviewed: 04/16/2015 Elsevier Interactive Patient Education  2017 [place] Prevention in the Home Falls can cause injuries. They can happen to people of all ages. There are many things you can do to make your home safe and to help prevent falls. What can I do on the outside of my home? Regularly fix the edges of walkways and driveways and fix any cracks. Remove anything that might make you trip as you walk through a door, such as a raised step or threshold. Trim any bushes or trees on the path to your home. Use bright outdoor lighting. Clear any walking paths of anything that might make someone trip, such as rocks or tools. Regularly check to see if handrails are loose or broken. Make sure that both sides of any steps have handrails. Any raised decks and porches should have guardrails on the edges. Have any leaves, snow, or ice cleared regularly. Use sand or salt on walking paths during winter. Clean up any spills in your garage right away. This includes oil or grease spills. What can I do in the bathroom? Use night lights. Install grab bars by the toilet and in the tub and shower. Do not use towel bars as grab bars. Use non-skid mats or decals in the tub or shower. If you need to sit down in the shower, use a plastic, non-slip stool. Keep the floor dry. Clean up any water that spills on the floor as soon as it happens. Remove soap buildup in  the tub or shower regularly. Attach bath mats securely with double-sided non-slip rug tape. Do not have throw rugs and other things on the floor that can make you trip. What can I do in the bedroom? Use night lights. Make sure that you have a light by your bed that is easy to reach. Do not use any sheets or blankets that are too big for your bed. They should not hang down onto the floor. Have a firm chair that has side arms. You can use this for support while you get dressed. Do not have throw rugs and other things on the floor that can make you trip. What can I do in the kitchen? Clean up any spills right away. Avoid walking on wet floors. Keep items that you use a lot in easy-to-reach places. If you need to reach something above you, use a strong step stool that has a grab bar. Keep electrical cords out of the way. Do not use floor polish or wax that makes floors slippery. If you must use wax, use non-skid floor wax. Do not have throw rugs and other things on the floor that can make you trip. What can I do with my stairs? Do not leave any items on the stairs. Make sure that there are handrails on both sides of the stairs and use them. Fix handrails that are broken or loose. Make sure that handrails are  as long as the stairways. Check any carpeting to make sure that it is firmly attached to the stairs. Fix any carpet that is loose or worn. Avoid having throw rugs at the top or bottom of the stairs. If you do have throw rugs, attach them to the floor with carpet tape. Make sure that you have a light switch at the top of the stairs and the bottom of the stairs. If you do not have them, ask someone to add them for you. What else can I do to help prevent falls? Wear shoes that: Do not have high heels. Have rubber bottoms. Are comfortable and fit you well. Are closed at the toe. Do not wear sandals. If you use a stepladder: Make sure that it is fully opened. Do not climb a closed  stepladder. Make sure that both sides of the stepladder are locked into place. Ask someone to hold it for you, if possible. Clearly mark and make sure that you can see: Any grab bars or handrails. First and last steps. Where the edge of each step is. Use tools that help you move around (mobility aids) if they are needed. These include: Canes. Walkers. Scooters. Crutches. Turn on the lights when you go into a dark area. Replace any light bulbs as soon as they burn out. Set up your furniture so you have a clear path. Avoid moving your furniture around. If any of your floors are uneven, fix them. If there are any pets around you, be aware of where they are. Review your medicines with your doctor. Some medicines can make you feel dizzy. This can increase your chance of falling. Ask your doctor what other things that you can do to help prevent falls. This information is not intended to replace advice given to you by your health care provider. Make sure you discuss any questions you have with your health care provider. Document Released: 04/11/2009 Document Revised: 11/21/2015 Document Reviewed: 07/20/2014 Elsevier Interactive Patient Education  2017 [employer].

## 2022-01-06 ENCOUNTER — Telehealth: Payer: Self-pay | Admitting: Medical

## 2022-01-06 NOTE — Telephone Encounter (Signed)
Pt is requesting a transition in care from [facility] to Dr. Edwina Barth.  Would this be ok?   Please advise

## 2022-01-06 NOTE — Telephone Encounter (Signed)
Okay with this transfer of care

## 2022-01-09 ENCOUNTER — Other Ambulatory Visit: Payer: Self-pay | Admitting: Medical

## 2022-01-15 ENCOUNTER — Other Ambulatory Visit: Payer: Self-pay | Admitting: Medical

## 2022-03-24 ENCOUNTER — Encounter: Payer: Self-pay | Admitting: Emergency Medicine

## 2022-03-24 ENCOUNTER — Ambulatory Visit (INDEPENDENT_AMBULATORY_CARE_PROVIDER_SITE_OTHER): Payer: Medicare Other | Admitting: Emergency Medicine

## 2022-03-24 VITALS — BP 110/60 | HR 63 | Temp 98.2°F | Ht 59.0 in | Wt 150.4 lb

## 2022-03-24 DIAGNOSIS — Z7689 Persons encountering health services in other specified circumstances: Secondary | ICD-10-CM | POA: Diagnosis not present

## 2022-03-24 DIAGNOSIS — M810 Age-related osteoporosis without current pathological fracture: Secondary | ICD-10-CM | POA: Insufficient documentation

## 2022-03-24 DIAGNOSIS — Z23 Encounter for immunization: Secondary | ICD-10-CM

## 2022-03-24 DIAGNOSIS — Z8669 Personal history of other diseases of the nervous system and sense organs: Secondary | ICD-10-CM | POA: Diagnosis not present

## 2022-03-24 DIAGNOSIS — E785 Hyperlipidemia, unspecified: Secondary | ICD-10-CM | POA: Diagnosis not present

## 2022-03-24 LAB — COMPREHENSIVE METABOLIC PANEL
ALT: 16 U/L (ref 0–35)
AST: 19 U/L (ref 0–37)
Albumin: 4.3 g/dL (ref 3.5–5.2)
Alkaline Phosphatase: 101 U/L (ref 39–117)
BUN: 13 mg/dL (ref 6–23)
CO2: 28 mEq/L (ref 19–32)
Calcium: 9.5 mg/dL (ref 8.4–10.5)
Chloride: 105 mEq/L (ref 96–112)
Creatinine, Ser: 0.63 mg/dL (ref 0.40–1.20)
GFR: 88.09 mL/min (ref >60.00)
Glucose, Bld: 104 mg/dL — ABNORMAL HIGH (ref 70–99)
Potassium: 4.1 mEq/L (ref 3.5–5.1)
Sodium: 137 mEq/L (ref 135–145)
Total Bilirubin: 0.3 mg/dL (ref 0.2–1.2)
Total Protein: 7.7 g/dL (ref 6.0–8.3)

## 2022-03-24 LAB — CBC WITH DIFFERENTIAL/PLATELET
Basophils Absolute: 0 10*3/uL (ref 0.0–0.1)
Basophils Relative: 0.6 % (ref 0.0–3.0)
Eosinophils Absolute: 0.1 10*3/uL (ref 0.0–0.7)
Eosinophils Relative: 1.4 % (ref 0.0–5.0)
HCT: 38.7 % (ref 36.0–46.0)
Hemoglobin: 13.4 g/dL (ref 12.0–15.0)
Lymphocytes Relative: 46.9 % — ABNORMAL HIGH (ref 12.0–46.0)
Lymphs Abs: 2.5 10*3/uL (ref 0.7–4.0)
MCHC: 34.6 g/dL (ref 30.0–36.0)
MCV: 93.7 fl (ref 78.0–100.0)
Monocytes Absolute: 0.3 10*3/uL (ref 0.1–1.0)
Monocytes Relative: 6.3 % (ref 3.0–12.0)
Neutro Abs: 2.4 10*3/uL (ref 1.4–7.7)
Neutrophils Relative %: 44.8 % (ref 43.0–77.0)
Platelets: 218 10*3/uL (ref 150.0–400.0)
RBC: 4.14 Mil/uL (ref 3.87–5.11)
RDW: 13.1 % (ref 11.5–15.5)
WBC: 5.4 10*3/uL (ref 4.0–10.5)

## 2022-03-24 LAB — LIPID PANEL
Cholesterol: 164 mg/dL (ref 0–200)
HDL: 91.9 mg/dL (ref >39.00)
LDL Cholesterol: 51 mg/dL (ref 0–99)
NonHDL: 72.56
Total CHOL/HDL Ratio: 2
Triglycerides: 106 mg/dL (ref 0.0–149.0)
VLDL: 21.2 mg/dL (ref 0.0–40.0)

## 2022-03-24 LAB — HEMOGLOBIN A1C: Hgb A1c MFr Bld: 5.5 % (ref 4.6–6.5)

## 2022-03-24 MED ORDER — ALENDRONATE SODIUM 70 MG PO TABS: 70.0000 mg | ORAL_TABLET | ORAL | 11 refills | Status: DC

## 2022-03-24 NOTE — Assessment & Plan Note (Signed)
Stable.  Diet and nutrition discussed. Lipid profile done today. Patient prefers not to take atorvastatin. The 10-year ASCVD risk score (Arnett DK, et al., 2019) is: 9.2%   Values used to calculate the score:     Age: 71 years     Sex: Female     Is Non-Hispanic African American: No     Diabetic: No     Tobacco smoker: No     Systolic Blood Pressure: 161 mmHg     Is BP treated: No     HDL Cholesterol: 86.9 mg/dL     Total Cholesterol: 163 mg/dL

## 2022-03-24 NOTE — Patient Instructions (Signed)
Mantenimiento de la salud despus de los 65 aos de edad Health Maintenance After Age 73 Despus de los 65 aos de edad, corre un riesgo mayor de padecer ciertas enfermedades e infecciones a largo plazo, como tambin de sufrir lesiones por cadas. Las cadas son la causa principal de las fracturas de huesos y lesiones en la cabeza de personas mayores de 65 aos de edad. Recibir cuidados preventivos de forma regular puede ayudarlo a mantenerse saludable y en buen estado. Los cuidados preventivos incluyen realizarse anlisis de forma regular y realizar cambios en el estilo de vida segn las recomendaciones del mdico. Converse con el mdico sobre lo siguiente: Las pruebas de deteccin y los anlisis que debe realizarse. Una prueba de deteccin es un estudio que se para detectar la presencia de una enfermedad cuando no tiene sntomas. Un plan de dieta y ejercicios adecuado para usted. Qu debo saber sobre las pruebas de deteccin y los anlisis para prevenir cadas? Realizarse pruebas de deteccin y anlisis es la mejor manera de detectar un problema de salud de forma temprana. El diagnstico y tratamiento tempranos le brindan la mejor oportunidad de controlar las afecciones mdicas que son comunes despus de los 65 aos de edad. Ciertas afecciones y elecciones de estilo de vida pueden hacer que sea ms propenso a sufrir una cada. El mdico puede recomendarle lo siguiente: Controles regulares de la visin. Una visin deficiente y afecciones como las cataratas pueden hacer que sea ms propenso a sufrir una cada. Si usa lentes, asegrese de obtener una receta actualizada si su visin cambia. Revisin de medicamentos. Revise regularmente con el mdico todos los medicamentos que toma, incluidos los medicamentos de venta libre. Consulte al mdico sobre los efectos secundarios que pueden hacer que sea ms propenso a sufrir una cada. Informe al mdico si alguno de los medicamentos que toma lo hace sentir mareado o  somnoliento. Controles de fuerza y equilibrio. El mdico puede recomendar ciertos estudios para controlar su fuerza y equilibrio al estar de pie, al caminar o al cambiar de posicin. Examen de los pies. El dolor y el adormecimiento en los pies, como tambin no utilizar el calzado adecuado, pueden hacer que sea ms propenso a sufrir una cada. Pruebas de deteccin, que incluyen las siguientes: Pruebas de deteccin para la osteoporosis. La osteoporosis es una afeccin que hace que los huesos se tornen ms dbiles y se quiebren con ms facilidad. Pruebas de deteccin para la presin arterial. Los cambios en la presin arterial y los medicamentos para controlar la presin arterial pueden hacerlo sentir mareado. Prueba de deteccin de la depresin. Es ms probable que sufra una cada si tiene miedo a caerse, se siente deprimido o se siente incapaz de realizar actividades que sola hacer. Prueba de deteccin de consumo de alcohol. Beber demasiado alcohol puede afectar su equilibrio y puede hacer que sea ms propenso a sufrir una cada. Siga estas indicaciones en su casa: Estilo de vida No beba alcohol si: Su mdico le indica no hacerlo. Si bebe alcohol: Limite la cantidad que bebe a lo siguiente: De 0 a 1 medida por da para las mujeres. De 0 a 2 medidas por da para los hombres. Sepa cunta cantidad de alcohol hay en las bebidas que toma. En [place], una medida equivale a una botella de cerveza de 12 oz (355 ml), un vaso de vino de 5 oz (148 ml) o un vaso de una bebida alcohlica de alta graduacin de 1 oz (44 ml). No consuma ningn producto que   contenga nicotina o tabaco. Estos productos incluyen cigarrillos, tabaco para mascar y aparatos de vapeo, como los cigarrillos electrnicos. Si necesita ayuda para dejar de consumir estos productos, consulte al mdico. Actividad  Siga un programa de ejercicio regular para mantenerse en forma. Esto lo ayudar a mantener el equilibrio. Consulte al  mdico qu tipos de ejercicios son adecuados para usted. Si necesita un bastn o un andador, selo segn las recomendaciones del mdico. Utilice calzado con buen apoyo y suela antideslizante. Seguridad  Retire los objetos que puedan causar tropiezos tales como alfombras, cables u obstculos. Instale equipos de seguridad, como barras para sostn en los baos y barandas de seguridad en las escaleras. Mantenga las habitaciones y los pasillos bien iluminados. Indicaciones generales Hable con el mdico sobre sus riesgos de sufrir una cada. Infrmele a su mdico si: Se cae. Asegrese de informarle a su mdico acerca de todas las cadas, incluso aquellas que parecen ser menores. Se siente mareado, cansado (tiene fatiga) o siente que pierde el equilibrio. Use los medicamentos de venta libre y los recetados solamente como se lo haya indicado el mdico. Estos incluyen suplementos. Siga una dieta sana y mantenga un peso saludable. Una dieta saludable incluye productos lcteos descremados, carnes bajas en contenido de grasa (magras), fibra de granos enteros, frijoles y muchas frutas y verduras. Mantngase al da con las vacunas. Realcese los estudios de rutina de la salud, dentales y de la vista. Resumen Tener un estilo de vida saludable y recibir cuidados preventivos pueden ayudar a promover la salud y el bienestar despus de los 65 aos de edad. Realizarse pruebas de deteccin y anlisis es la mejor manera de detectar un problema de salud de forma temprana y ayudarlo a evitar una cada. El diagnstico y tratamiento tempranos le brindan la mejor oportunidad de controlar las afecciones mdicas ms comunes en las personas mayores de 65 aos de edad. Las cadas son la causa principal de las fracturas de huesos y lesiones en la cabeza de personas mayores de 65 aos de edad. Tome precauciones para evitar una cada en su casa. Trabaje con el mdico para saber qu cambios que puede hacer para mejorar su salud y  bienestar, y para prevenir las cadas. Esta informacin no tiene como fin reemplazar el consejo del mdico. Asegrese de hacerle al mdico cualquier pregunta que tenga. Document Revised: 11/20/2020 Document Reviewed: 11/20/2020 Elsevier Patient Education  2023 [employer]  

## 2022-03-24 NOTE — Progress Notes (Signed)
Helmut Muster de [place] 71 y.o.   Chief Complaint  Patient presents with   New Patient (Initial Visit)    No concerns     HISTORY OF PRESENT ILLNESS: This is a 71 y.o. female first visit to this office, here to establish care. Past medical history includes glaucoma, dyslipidemia, and osteoporosis. Has no complaints or medical concerns today.  HPI   Prior to Admission medications   Medication Sig Start Date End Date Taking? Authorizing Provider  alendronate (FOSAMAX) 10 MG tablet TAKE 1 TABLET BY MOUTH EVERY DAY TAKE WITH A FULL GLASS OF WATER ON AN EMPTY STOMACH. 01/15/22  Yes Saguier, Ramon Dredge, PA-C  atorvastatin (LIPITOR) 10 MG tablet TAKE 1 TABLET BY MOUTH EVERY DAY 01/09/22  Yes Saguier, Ramon Dredge, PA-C  CALCIUM PO Take by mouth daily.   Yes [provider]  latanoprost (XALATAN) 0.005 % ophthalmic solution Place 1 drop into both eyes at bedtime. 01/22/21  Yes [provider]  Omega-3 Fatty Acids (FISH OIL PO) Take by mouth daily.   Yes [provider]    No Known Allergies  There are no problems to display for this patient.   Past Medical History:  Diagnosis Date   Glaucoma     Past Surgical History:  Procedure Laterality Date   ABDOMINAL HYSTERECTOMY     CHOLECYSTECTOMY      Social History   Socioeconomic History   Marital status: Married    Spouse name: Not on file   Number of children: 1   Years of education: Not on file   Highest education level: Not on file  Occupational History   Not on file  Tobacco Use   Smoking status: Never   Smokeless tobacco: Never  Vaping Use   Vaping Use: Never used  Substance and Sexual Activity   Alcohol use: Yes    Comment: occ   Drug use: Never   Sexual activity: Yes  Other Topics Concern   Not on file  Social History Narrative   Not on file   Social Determinants of Health   Financial Resource Strain: Low Risk  (12/10/2021)   Overall Financial Resource Strain (CARDIA)    Difficulty of  Paying Living Expenses: Not hard at all  Food Insecurity: No Food Insecurity (12/10/2021)   Hunger Vital Sign    Worried About Running Out of Food in the Last Year: Never true    Ran Out of Food in the Last Year: Never true  Transportation Needs: No Transportation Needs (12/10/2021)   PRAPARE - Administrator, Civil Service (Medical): No    Lack of Transportation (Non-Medical): No  Physical Activity: Inactive (12/10/2021)   Exercise Vital Sign    Days of Exercise per Week: 0 days    Minutes of Exercise per Session: 0 min  Stress: No Stress Concern Present (12/10/2021)   [employer] of Occupational Health - Occupational Stress Questionnaire    Feeling of Stress : Not at all  Social Connections: Moderately Integrated (12/10/2021)   Social Connection and Isolation Panel [NHANES]    Frequency of Communication with Friends and Family: More than three times a week    Frequency of Social Gatherings with Friends and Family: More than three times a week    Attends Religious Services: More than 4 times per year    Active Member of [employer] or Organizations: No    Attends Banker Meetings: Never    Marital Status: Married  Catering manager Violence: Not At  Risk (12/10/2021)   Humiliation, Afraid, Rape, and Kick questionnaire    Fear of Current or Ex-Partner: No    Emotionally Abused: No    Physically Abused: No    Sexually Abused: No    Family History  Problem Relation Age of Onset   Headache Neg Hx      Review of Systems  Constitutional: Negative.  Negative for chills and fever.  HENT: Negative.  Negative for congestion and sore throat.   Respiratory: Negative.  Negative for cough and shortness of breath.   Cardiovascular: Negative.  Negative for chest pain and palpitations.  Gastrointestinal: Negative.  Negative for abdominal pain, diarrhea, nausea and vomiting.  Genitourinary: Negative.   Musculoskeletal: Negative.   Skin: Negative.  Negative for rash.   Neurological: Negative.  Negative for dizziness and headaches.  All other systems reviewed and are negative.   Today's Vitals   03/24/22 1336  BP: 110/60  Pulse: 63  Temp: 98.2 F (36.8 C)  TempSrc: Oral  SpO2: 96%  Weight: 150 lb 6 oz (68.2 kg)  Height: 4\' 11"  (1.499 m)   Body mass index is 30.37 kg/m.  Physical Exam Vitals reviewed.  Constitutional:      Appearance: Normal appearance.  HENT:     Head: Normocephalic.     Mouth/Throat:     Mouth: Mucous membranes are moist.     Pharynx: Oropharynx is clear.  Eyes:     Extraocular Movements: Extraocular movements intact.     Conjunctiva/sclera: Conjunctivae normal.     Pupils: Pupils are equal, round, and reactive to light.  Cardiovascular:     Rate and Rhythm: Normal rate and regular rhythm.     Pulses: Normal pulses.     Heart sounds: Normal heart sounds.  Pulmonary:     Effort: Pulmonary effort is normal.     Breath sounds: Normal breath sounds.  Abdominal:     Tenderness: There is no abdominal tenderness.  Musculoskeletal:     Cervical back: No tenderness.     Right lower leg: No edema.     Left lower leg: No edema.  Lymphadenopathy:     Cervical: No cervical adenopathy.  Skin:    General: Skin is warm and dry.  Neurological:     General: No focal deficit present.     Mental Status: She is alert and oriented to person, place, and time.  Psychiatric:        Mood and Affect: Mood normal.        Behavior: Behavior normal.      ASSESSMENT & PLAN: A total of 49 minutes was spent with the patient and counseling/coordination of care regarding preparing for this visit, review of available medical records, establishing care with me, comprehensive history and physical examination, review of all medications, review of multiple chronic medical problems under management, review of most recent blood work results, education on nutrition, prognosis, documentation, need for follow-up.  Problem List Items Addressed  This Visit       Musculoskeletal and Integument   Age-related osteoporosis without current pathological fracture    Stable.  On daily Fosamax 10 mg. Recommend weekly 70 mg dose. No history of pathological fractures. Good nutrition.  Stays physically active.      Relevant Medications   alendronate (FOSAMAX) 70 MG tablet     Other   Dyslipidemia - Primary    Stable.  Diet and nutrition discussed. Lipid profile done today. Patient prefers not to take atorvastatin. The 10-year ASCVD risk  score (Arnett DK, et al., 2019) is: 9.2%   Values used to calculate the score:     Age: 89 years     Sex: Female     Is Non-Hispanic African American: No     Diabetic: No     Tobacco smoker: No     Systolic Blood Pressure: 110 mmHg     Is BP treated: No     HDL Cholesterol: 86.9 mg/dL     Total Cholesterol: 163 mg/dL       Relevant Orders   Comprehensive metabolic panel   CBC with Differential/Platelet   Hemoglobin A1c   Lipid panel   Other Visit Diagnoses     Need for vaccination       Relevant Orders   Pneumococcal conjugate vaccine 20-valent (Prevnar 20)   History of glaucoma       Encounter to establish care          Patient Instructions  Mantenimiento de la salud despus de los 65 aos de edad Health Maintenance After Age 77 Despus de los 65 aos de edad, corre un riesgo mayor de Film/video editor enfermedades e infecciones a Air cabin crew, como tambin de sufrir lesiones por cadas. Las cadas son la causa principal de las fracturas de huesos y lesiones en la cabeza de personas mayores de 65 aos de edad. Recibir cuidados preventivos de forma regular puede ayudarlo a mantenerse saludable y en buen [place]. Los cuidados preventivos incluyen realizarse anlisis de forma regular y Forensic psychologist en el estilo de vida segn las recomendaciones del mdico. Converse con el mdico sobre lo siguiente: Las pruebas de deteccin y los anlisis que debe International aid/development worker. Una prueba de deteccin es  un estudio que se para Engineer, manufacturing la presencia de una enfermedad cuando no tiene sntomas. Un plan de dieta y ejercicios adecuado para usted. Qu debo saber sobre las pruebas de deteccin y los anlisis para prevenir cadas? Realizarse pruebas de deteccin y [employer] es la mejor manera de Engineer, manufacturing un problema de salud de forma temprana. El diagnstico y tratamiento tempranos le brindan la mejor oportunidad de Chief Operating Officer las afecciones mdicas que son comunes despus de los 65 aos de edad. Ciertas afecciones y elecciones de estilo de vida pueden hacer que sea ms propenso a sufrir Engineer, site. El mdico puede recomendarle lo siguiente: Controles regulares de la visin. Una visin deficiente y afecciones como las cataratas pueden hacer que sea ms propenso a sufrir Engineer, site. Si [place] lentes, asegrese de obtener una receta actualizada si su visin cambia. Revisin de medicamentos. Revise regularmente con el mdico todos los medicamentos que toma, incluidos los medicamentos de [place]. Consulte al [employer] efectos secundarios que pueden hacer que sea ms propenso a sufrir Engineer, site. Informe al mdico si alguno de los medicamentos que toma lo hace sentir mareado o somnoliento. Controles de fuerza y equilibrio. El mdico puede recomendar ciertos estudios para controlar su fuerza y equilibrio al estar de pie, al caminar o al cambiar de posicin. Examen de los pies. El dolor y Materials engineer en los pies, como tambin no utilizar el calzado [place], pueden hacer que sea ms propenso a sufrir Engineer, site. Pruebas de deteccin, que incluyen las siguientes: Pruebas de deteccin para la osteoporosis. La osteoporosis es una afeccin que hace que los huesos se tornen ms dbiles y se quiebren con ms facilidad. Pruebas de deteccin para la presin arterial. Los cambios en la presin arterial y los medicamentos para controlar la presin arterial pueden hacerlo  sentir mareado. Prueba de deteccin de la  depresin. Es ms probable que sufra una cada si tiene miedo a caerse, se siente deprimido o se siente incapaz de Probation officer. Prueba de deteccin de consumo de alcohol. Beber demasiado alcohol puede afectar su equilibrio y puede hacer que sea ms propenso a sufrir Engineer, site. Siga estas indicaciones en su casa: Estilo de vida No beba alcohol si: Su mdico le indica no hacerlo. Si bebe alcohol: Limite la cantidad que bebe a lo siguiente: De 0 a 1 medida por da para las mujeres. De 0 a 2 medidas por da para los hombres. Sepa cunta cantidad de alcohol hay en las bebidas que toma. En los [address], una medida equivale a una botella de cerveza de 12 oz (355 ml), un vaso de vino de 5 oz (148 ml) o un vaso de una bebida alcohlica de alta graduacin de 1 oz (44 ml). No consuma ningn producto que contenga nicotina o tabaco. Estos productos incluyen cigarrillos, tabaco para Theatre manager y aparatos de vapeo, como los Administrator, Civil Service. Si necesita ayuda para dejar de consumir estos productos, consulte al [employer]. Actividad  Siga un programa de ejercicio regular para mantenerse en forma. Esto lo ayudar a Radio producer equilibrio. Consulte al mdico qu tipos de ejercicios son adecuados para usted. Si necesita un bastn o un andador, selo segn las recomendaciones del mdico. Utilice calzado con buen apoyo y suela antideslizante. Seguridad  Retire los [employer] puedan causar tropiezos tales como alfombras, cables u obstculos. Instale equipos de seguridad, como barras para sostn en los baos y barandas de seguridad en las escaleras. Mantenga las habitaciones y los pasillos bien iluminados. Indicaciones generales Hable con el mdico sobre sus riesgos de sufrir una cada. Infrmele a su mdico si: Se cae. Asegrese de informarle a su mdico acerca de todas las cadas, incluso aquellas que parecen ser [employer]. Se siente mareado, cansado (tiene fatiga) o siente que  pierde el equilibrio. Use los medicamentos de venta libre y los recetados solamente como se lo haya indicado el mdico. Estos incluyen suplementos. Siga una dieta sana y [place] un peso saludable. Una dieta saludable incluye productos lcteos descremados, carnes bajas en contenido de grasa ([place]), fibra de granos enteros, frijoles y [place] frutas y verduras. Mantngase al da con las vacunas. Realcese los estudios de rutina de la salud, dentales y de Wellsite geologist. Resumen Tener un estilo de vida saludable y recibir cuidados preventivos pueden ayudar a Research scientist (physical sciences) salud y el bienestar despus de los 65 aos de [place]. Realizarse pruebas de deteccin y [employer] es la mejor manera de Engineer, manufacturing un problema de salud de forma temprana y [place] a Automotive engineer una cada. El diagnstico y tratamiento tempranos le brindan la mejor oportunidad de Chief Operating Officer las afecciones mdicas ms comunes en las personas mayores de 65 aos de edad. Las cadas son la causa principal de las fracturas de huesos y lesiones en la cabeza de personas mayores de 65 aos de edad. Tome precauciones para evitar una cada en su casa. Trabaje con el mdico para saber qu cambios que puede hacer para mejorar su salud y [place], y para prevenir las cadas. Esta informacin no tiene Theme park manager el consejo del mdico. Asegrese de hacerle al mdico cualquier pregunta que tenga. Document Revised: 11/20/2020 Document Reviewed: 11/20/2020 Elsevier Patient Education  2023 Elsevier Inc.     Edwina Barth, MD [facility]

## 2022-03-24 NOTE — Assessment & Plan Note (Signed)
Stable.  On daily Fosamax 10 mg. Recommend weekly 70 mg dose. No history of pathological fractures. Good nutrition.  Stays physically active.

## 2022-03-25 ENCOUNTER — Other Ambulatory Visit: Payer: Self-pay | Admitting: Medical

## 2022-04-02 DIAGNOSIS — Z961 Presence of intraocular lens: Secondary | ICD-10-CM | POA: Diagnosis not present

## 2022-04-02 DIAGNOSIS — H04123 Dry eye syndrome of bilateral lacrimal glands: Secondary | ICD-10-CM | POA: Diagnosis not present

## 2022-04-02 DIAGNOSIS — H524 Presbyopia: Secondary | ICD-10-CM | POA: Diagnosis not present

## 2022-04-02 DIAGNOSIS — H401131 Primary open-angle glaucoma, bilateral, mild stage: Secondary | ICD-10-CM | POA: Diagnosis not present

## 2022-04-02 DIAGNOSIS — H52223 Regular astigmatism, bilateral: Secondary | ICD-10-CM | POA: Diagnosis not present

## 2022-04-02 DIAGNOSIS — H43813 Vitreous degeneration, bilateral: Secondary | ICD-10-CM | POA: Diagnosis not present

## 2022-04-10 DIAGNOSIS — Z1231 Encounter for screening mammogram for malignant neoplasm of breast: Secondary | ICD-10-CM | POA: Diagnosis not present

## 2022-07-19 ENCOUNTER — Other Ambulatory Visit: Payer: Self-pay | Admitting: Medical

## 2022-08-31 DIAGNOSIS — H52223 Regular astigmatism, bilateral: Secondary | ICD-10-CM | POA: Diagnosis not present

## 2022-08-31 DIAGNOSIS — H524 Presbyopia: Secondary | ICD-10-CM | POA: Diagnosis not present

## 2022-08-31 DIAGNOSIS — H26493 Other secondary cataract, bilateral: Secondary | ICD-10-CM | POA: Diagnosis not present

## 2022-08-31 DIAGNOSIS — H04123 Dry eye syndrome of bilateral lacrimal glands: Secondary | ICD-10-CM | POA: Diagnosis not present

## 2022-08-31 DIAGNOSIS — H43813 Vitreous degeneration, bilateral: Secondary | ICD-10-CM | POA: Diagnosis not present

## 2022-08-31 DIAGNOSIS — Z961 Presence of intraocular lens: Secondary | ICD-10-CM | POA: Diagnosis not present

## 2022-08-31 DIAGNOSIS — H401131 Primary open-angle glaucoma, bilateral, mild stage: Secondary | ICD-10-CM | POA: Diagnosis not present

## 2022-09-14 DIAGNOSIS — H26493 Other secondary cataract, bilateral: Secondary | ICD-10-CM | POA: Diagnosis not present

## 2022-09-22 ENCOUNTER — Encounter: Payer: Self-pay | Admitting: Emergency Medicine

## 2022-09-22 ENCOUNTER — Ambulatory Visit (INDEPENDENT_AMBULATORY_CARE_PROVIDER_SITE_OTHER): Payer: Medicare Other | Admitting: Emergency Medicine

## 2022-09-22 VITALS — BP 132/76 | HR 70 | Temp 98.7°F | Ht 59.0 in | Wt 156.0 lb

## 2022-09-22 DIAGNOSIS — F4323 Adjustment disorder with mixed anxiety and depressed mood: Secondary | ICD-10-CM | POA: Insufficient documentation

## 2022-09-22 DIAGNOSIS — M810 Age-related osteoporosis without current pathological fracture: Secondary | ICD-10-CM

## 2022-09-22 DIAGNOSIS — E785 Hyperlipidemia, unspecified: Secondary | ICD-10-CM

## 2022-09-22 HISTORY — DX: Adjustment disorder with mixed anxiety and depressed mood: F43.23

## 2022-09-22 MED ORDER — DULOXETINE HCL 30 MG PO CPEP: 30.0000 mg | ORAL_CAPSULE | Freq: Every day | ORAL | 3 refills | Status: AC

## 2022-09-22 NOTE — Assessment & Plan Note (Signed)
Stable.  No recent trauma. No history of fractures. Continues to take daily calcium supplementation and weekly Fosamax 70 mg

## 2022-09-22 NOTE — Progress Notes (Signed)
[place] 72 y.o.   Chief Complaint  Patient presents with   Medical Management of Chronic Issues    87mnth f/u appt, left shoulder pain, stress, tension pain     HISTORY OF PRESENT ILLNESS: This is a 72 y.o. female here for 43-month follow-up of chronic medical problems. Mostly complaining of anxiety and depression related to situation with husband at home.  Family matters. Increased stress leading to joint pains and occasional headaches.  Sleeping has been disrupted because of that. Lives with husband.  Family in [place] where they lived for 37 years.  Moved to this area 3 years ago.  Not happy.  Misses family. No other complaints or medical concerns today.  HPI   Prior to Admission medications   Medication Sig Start Date End Date Taking? Authorizing Provider  alendronate (FOSAMAX) 70 MG tablet Take 1 tablet (70 mg total) by mouth every 7 (seven) days. Take with a full glass of water on an empty stomach. 03/24/22   Horald Pollen, MD  atorvastatin (LIPITOR) 10 MG tablet TAKE 1 TABLET BY MOUTH EVERY DAY 07/20/22   Saguier, Percell Miller, PA-C  CALCIUM PO Take by mouth daily.    [provider]  latanoprost (XALATAN) 0.005 % ophthalmic solution Place 1 drop into both eyes at bedtime. 01/22/21   [provider]  Omega-3 Fatty Acids (FISH OIL PO) Take by mouth daily.    [provider]    No Known Allergies  Patient Active Problem List   Diagnosis Date Noted   Age-related osteoporosis without current pathological fracture 03/24/2022   Dyslipidemia 03/24/2022    Past Medical History:  Diagnosis Date   Glaucoma     Past Surgical History:  Procedure Laterality Date   ABDOMINAL HYSTERECTOMY     CHOLECYSTECTOMY      Social History   Socioeconomic History   Marital status: Married    Spouse name: Not on file   Number of children: 1   Years of education: Not on file   Highest education level: Not on file  Occupational History    Not on file  Tobacco Use   Smoking status: Never   Smokeless tobacco: Never  Vaping Use   Vaping Use: Never used  Substance and Sexual Activity   Alcohol use: Yes    Comment: occ   Drug use: Never   Sexual activity: Yes  Other Topics Concern   Not on file  Social History Narrative   Not on file   Social Determinants of Health   Financial Resource Strain: Low Risk  (12/10/2021)   Overall Financial Resource Strain (CARDIA)    Difficulty of Paying Living Expenses: Not hard at all  Food Insecurity: No Food Insecurity (12/10/2021)   Hunger Vital Sign    Worried About Running Out of Food in the Last Year: Never true    [place] in the Last Year: Never true  Transportation Needs: No Transportation Needs (12/10/2021)   PRAPARE - Hydrologist (Medical): No    Lack of Transportation (Non-Medical): No  Physical Activity: Inactive (12/10/2021)   Exercise Vital Sign    Days of Exercise per Week: 0 days    Minutes of Exercise per Session: 0 min  Stress: No Stress Concern Present (12/10/2021)   [place]    Feeling of Stress : Not at all  Social Connections: Moderately Integrated (12/10/2021)   Social Connection and  Isolation Panel [NHANES]    Frequency of Communication with Friends and Family: More than three times a week    Frequency of Social Gatherings with Friends and Family: More than three times a week    Attends Religious Services: More than 4 times per year    Active Member of [employer] or Organizations: No    Attends Archivist Meetings: Never    Marital Status: Married  Human resources officer Violence: Not At Risk (12/10/2021)   Humiliation, Afraid, Rape, and Kick questionnaire    Fear of Current or Ex-Partner: No    Emotionally Abused: No    Physically Abused: No    Sexually Abused: No    Family History  Problem Relation Age of Onset   Headache Neg Hx      Review of  Systems  Constitutional: Negative.  Negative for chills and fever.  HENT: Negative.  Negative for congestion and sore throat.   Respiratory: Negative.  Negative for cough and shortness of breath.   Cardiovascular: Negative.  Negative for chest pain and palpitations.  Gastrointestinal:  Negative for abdominal pain, nausea and vomiting.  Musculoskeletal:  Positive for joint pain.  Skin: Negative.  Negative for rash.  Neurological: Negative.  Negative for dizziness and headaches.  Psychiatric/Behavioral:  Positive for depression. The patient is nervous/anxious and has insomnia.   All other systems reviewed and are negative.   There were no vitals filed for this visit.  Physical Exam Vitals reviewed.  Constitutional:      Appearance: Normal appearance.  HENT:     Head: Normocephalic.  Eyes:     Extraocular Movements: Extraocular movements intact.  Cardiovascular:     Rate and Rhythm: Normal rate and regular rhythm.     Heart sounds: Normal heart sounds.  Pulmonary:     Effort: Pulmonary effort is normal.     Breath sounds: Normal breath sounds.  Skin:    General: Skin is warm and dry.  Neurological:     Mental Status: She is alert and oriented to person, place, and time.  Psychiatric:        Mood and Affect: Mood normal.        Behavior: Behavior normal.      ASSESSMENT & PLAN: A total of 45 minutes was spent with the patient and counseling/coordination of care regarding preparing for this visit, review of most recent office visit notes, review of chronic medical conditions, review of all medications, diagnosis of situational anxiety and depression and management including starting medication, stress management, education on nutrition, cardiovascular risks associated with dyslipidemia, prognosis, documentation, need for follow-up.  Problem List Items Addressed This Visit       Musculoskeletal and Integument   Age-related osteoporosis without current pathological fracture     Stable.  No recent trauma. No history of fractures. Continues to take daily calcium supplementation and weekly Fosamax 70 mg        Other   Dyslipidemia    Chronic stable condition Continues atorvastatin 10 mg daily. 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 Creating multiple symptoms including joint pains, headaches, and insomnia. Recommend to start Cymbalta 30 mg daily. Stress management discussed.      Relevant Medications   DULoxetine (CYMBALTA) 30 MG capsule   Patient Instructions  Estrs en los adultos Stress, Adult El estrs es una reaccin normal a los sucesos de la vida. Es lo que se siente cuando la vida le exige ms de lo que est acostumbrado o ms de lo que cree que puede University. Un poco de estrs puede ser til, por ejemplo, cuando se estudia para una prueba o se debe cumplir con una fecha lmite para un trabajo. El estrs muy frecuente o que dura mucho tiempo puede causar problemas. El estrs prolongado se denomina estrs crnico. El estrs crnico puede afectar la salud emocional y [place] relaciones y las actividades cotidianas normales. El exceso de estrs puede Academic librarian sistema de defensa del cuerpo (sistema inmunitario) y aumentar el riesgo de tener enfermedades fsicas. Si ya tiene un problema mdico, el estrs puede empeorarlo. Cules son las causas? Todos los sucesos de la vida pueden causar estrs. Un suceso que le causa estrs a una persona puede no ser estresante para Theatre manager. Los sucesos ms importantes de la vida, sean positivos o  negativos, suelen causar estrs. Por ejemplo: Perder un trabajo o empezar un Nicholas Lose. Perder a un ser querido. Mudarse a una casa o un barrio [place]. Casarse o divorciarse. Tener un beb. Lesionarse o enfermarse. Los sucesos de la vida menos evidentes tambin pueden causar estrs, especialmente si ocurren da tras da o combinados entre s. Por ejemplo: Trabajar muchas horas. Conducir en medio del trfico. Cuidar de los nios. Tener deudas. Estar en una relacin difcil. Cules son los signos o sntomas? El estrs puede causar sntomas emocionales y fsicos y puede dar Environmental consultant a conductas poco saludables. Estos incluyen los siguientes: Sntomas emocionales Ansiedad. Sentirse preocupado, temeroso, nervioso, abrumado o fuera de control. Enojo, incluso irritacin o impaciencia. Depresin. Sentirse triste, decado, desesperanzado o culpable. Dificultad para concentrarse, recordar o tomar decisiones. Sntomas fsicos Dolores y [place]. Estos pueden afectar la cabeza, el cuello, la espalda, el estmago u otras zonas del cuerpo. Rigidez muscular o tensin mandibular. Poca energa. Dificultad para dormir. Conductas poco saludables [place] para sentirse mejor (comer en exceso) o saltear comidas. Trabajar mucho o postergar tareas. Fumar, beber alcohol o consumir drogas para sentirse mejor. Cmo se diagnostica? El trastorno por estrs se diagnostica a travs de una evaluacin que realiza el mdico. Un trastorno por estrs puede diagnosticarse en funcin de lo siguiente: Sus sntomas y cualquier suceso estresante que le haya ocurrido en la vida. Sus antecedentes mdicos. Estudios para Freight forwarder causas de los sntomas. Segn la afeccin, el mdico podr derivarlo a un especialista para que le haga ms evaluaciones. Cmo se trata?  El tratamiento recomendado para el estrs son las tcnicas de control del estrs. Generalmente, no se recomiendan medicamentos para tratar el  estrs. Entre las tcnicas para reducir su respuesta a los sucesos estresantes de la vida, se incluyen las siguientes: Marine scientist. Obsrvese a usted mismo para detectar sntomas de estrs e identificar sus causas. Estas habilidades pueden ayudarlo a evitar sucesos estresantes o a prepararse para ellos. Gaffer. Establezca las prioridades, lleve un calendario de los sucesos y aprenda a Software engineer "no". Estas acciones pueden ayudarle a evitar  asumir demasiado. Las tcnicas para lidiar con el estrs incluyen lo siguiente: Replantearse el problema. Trate de pensar de un modo Tourist information centre manager de ignorarlos o Horticulturist, commercial. Intente encontrar los aspectos positivos en una situacin estresante, en lugar de enfocarse en los negativos. Actividad fsica. El ejercicio fsico puede liberar la tensin fsica y Architectural technologist. La clave es encontrar un tipo de ejercicio fsico que disfrute y practique con regularidad. Tcnicas de relajacin. Estas relajan el cuerpo y la Bess[place]. Encuentre una o ms opciones que disfrute y practquela con regularidad. Por ejemplo: Meditacin, respiracin profunda o tcnicas de relajacin progresiva. Yoga o tai chi. Biorretroalimentacin, tcnicas de plena conciencia o llevar un diario. Escuchar msica, estar en contacto con la naturaleza o participar en otros pasatiempos. Llevar un estilo de vida saludable. Siga una dieta equilibrada, tome mucha agua, limite o evite la cafena y [place]. Tener una red de [employer]. Pase tiempo con la familia, los amigos y otras personas cuya compaa disfrute. Exprese sus sentimientos y converse acerca de las cosas que le preocupan con alguien en quien confe. La orientacin o la psicoterapia con un profesional de salud mental pueden ser de ayuda si tiene dificultades para controlar el estrs usted solo. Siga estas instrucciones en su casa: Estilo de [place] drogas. No consuma  ningn producto que contenga nicotina o tabaco. Estos productos incluyen cigarrillos, tabaco para Higher education careers adviser y aparatos de vapeo, como los Psychologist, sport and exercise. Si necesita ayuda para dejar de fumar, consulte al mdico. Si bebe alcohol: Limite la cantidad que bebe a lo siguiente: De 0 a 1 medida por da para las mujeres que no estn embarazadas. De 0 a 2 medidas por da para los hombres. Sepa cunta cantidad de alcohol hay en las bebidas. En los [place], una medida equivale a una botella de cerveza de 12 oz (355 ml), un vaso de vino de 5 oz (148 ml) o un vaso de una bebida alcohlica de alta graduacin de 1 oz (44 ml). No consuma alcohol ni drogas para relajarse. Lleve una dieta equilibrada que incluya frutas y verduras frescas, cereales integrales, carnes magras, pescado, huevos, frijoles y lcteos descremados. Evite los alimentos procesados y los alimentos con alto contenido de grasa, Location manager y sal. Sherilyn Cooter al menos 30 minutos de ejercicio 5 o ms das de la semana. Intente dormir de 7 a 8 horas todas las noches. Instrucciones generales  Practique tcnicas de control del estrs como se lo haya indicado el mdico. Beba suficiente lquido como para Theatre manager la orina de color amarillo plido. Use los medicamentos de venta libre y los recetados solamente como se lo haya indicado el mdico. Concurra a todas las visitas de seguimiento. Esto es importante. Comunquese con un mdico si: Sus sntomas empeoran. Aparecen nuevos sntomas. Se siente abrumado por los problemas y ya no puede manejarlos solo. Solicite ayuda de inmediato si: Piensa acerca de lastimarse a usted mismo o a Producer, television/film/video. Busque ayuda de inmediatosi alguna vez siente que puede hacerse dao a usted mismo o a otros, o tiene pensamientos de Doctor, hospital a su vida. Dirjase al centro de urgencias ms cercano o: Llame al 911. Llame a National Suicide Prevention Lifeline ([place] para la Prevencin del Suicidio)  al [phone] o al 988. Est disponible las 24 horas del da. Enve un mensaje de texto a la lnea para casos de crisis al [phone]. Resumen El estrs es una reaccin normal a los sucesos de la vida. Puede causar  problemas si es muy frecuente o dura mucho tiempo. La mejor forma de tratar el estrs es Psychologist, prison and probation services las tcnicas de control del estrs. La orientacin o la psicoterapia con un profesional de salud mental pueden ser de ayuda si tiene dificultades para controlar el estrs usted solo. Esta informacin no tiene Marine scientist el consejo del mdico. Asegrese de hacerle al mdico cualquier pregunta que tenga. Document Revised: 02/26/2021 Document Reviewed: 02/26/2021 Elsevier Patient Education  [place], MD [facility]

## 2022-09-22 NOTE — Patient Instructions (Signed)
Estrs en los adultos Stress, Adult El estrs es una reaccin normal a los sucesos de la vida. Es lo que se siente cuando la vida le exige ms de lo que est acostumbrado o ms de lo que cree que puede manejar. Un poco de estrs puede ser til, por ejemplo, cuando se estudia para una prueba o se debe cumplir con una fecha lmite para un trabajo. El estrs muy frecuente o que dura mucho tiempo puede causar problemas. El estrs prolongado se denomina estrs crnico. El estrs crnico puede afectar la salud emocional y dificultar las relaciones y las actividades cotidianas normales. El exceso de estrs puede debilitar el sistema de defensa del cuerpo (sistema inmunitario) y aumentar el riesgo de tener enfermedades fsicas. Si ya tiene un problema mdico, el estrs puede empeorarlo. Cules son las causas? Todos los sucesos de la vida pueden causar estrs. Un suceso que le causa estrs a una persona puede no ser estresante para otra. Los sucesos ms importantes de la vida, sean positivos o negativos, suelen causar estrs. Por ejemplo: Perder un trabajo o empezar un trabajo nuevo. Perder a un ser querido. Mudarse a una casa o un barrio nuevos. Casarse o divorciarse. Tener un beb. Lesionarse o enfermarse. Los sucesos de la vida menos evidentes tambin pueden causar estrs, especialmente si ocurren da tras da o combinados entre s. Por ejemplo: Trabajar muchas horas. Conducir en medio del trfico. Cuidar de los nios. Tener deudas. Estar en una relacin difcil. Cules son los signos o sntomas? El estrs puede causar sntomas emocionales y fsicos y puede dar lugar a conductas poco saludables. Estos incluyen los siguientes: Sntomas emocionales Ansiedad. Sentirse preocupado, temeroso, nervioso, abrumado o fuera de control. Enojo, incluso irritacin o impaciencia. Depresin. Sentirse triste, decado, desesperanzado o culpable. Dificultad para concentrarse, recordar o tomar decisiones. Sntomas  fsicos Dolores y molestias. Estos pueden afectar la cabeza, el cuello, la espalda, el estmago u otras zonas del cuerpo. Rigidez muscular o tensin mandibular. Poca energa. Dificultad para dormir. Conductas poco saludables [place] para sentirse mejor (comer en exceso) o saltear comidas. Trabajar mucho o postergar tareas. Fumar, beber alcohol o consumir drogas para sentirse mejor. Cmo se diagnostica? El trastorno por estrs se diagnostica a travs de una evaluacin que realiza el mdico. Un trastorno por estrs puede diagnosticarse en funcin de lo siguiente: Sus sntomas y cualquier suceso estresante que le haya ocurrido en la vida. Sus antecedentes mdicos. Estudios para descartar otras causas de los sntomas. Segn la afeccin, el mdico podr derivarlo a un especialista para que le haga ms evaluaciones. Cmo se trata?  El tratamiento recomendado para el estrs son las tcnicas de control del estrs. Generalmente, no se recomiendan medicamentos para tratar el estrs. Entre las tcnicas para reducir su respuesta a los sucesos estresantes de la vida, se incluyen las siguientes: Identificar el estrs. Obsrvese a usted mismo para detectar sntomas de estrs e identificar sus causas. Estas habilidades pueden ayudarlo a evitar sucesos estresantes o a prepararse para ellos. Gestionar el tiempo. Establezca las prioridades, lleve un calendario de los sucesos y aprenda a decir "no". Estas acciones pueden ayudarle a evitar asumir demasiado. Las tcnicas para lidiar con el estrs incluyen lo siguiente: Replantearse el problema. Trate de pensar de un modo realista los eventos estresantes en lugar de ignorarlos o tener reacciones exageradas. Intente encontrar los aspectos positivos en una situacin estresante, en lugar de enfocarse en los negativos. Actividad fsica. El ejercicio fsico puede liberar la tensin fsica y emocional. La clave es encontrar un   tipo de ejercicio fsico que disfrute y  practique con regularidad. Tcnicas de relajacin. Estas relajan el cuerpo y la mente. Encuentre una o ms opciones que disfrute y practquela con regularidad. Por ejemplo: Meditacin, respiracin profunda o tcnicas de relajacin progresiva. Yoga o tai chi. Biorretroalimentacin, tcnicas de plena conciencia o llevar un diario. Escuchar msica, estar en contacto con la naturaleza o participar en otros pasatiempos. Llevar un estilo de vida saludable. Siga una dieta equilibrada, tome mucha agua, limite o evite la cafena y duerma mucho. Tener una red de apoyo fuerte. Pase tiempo con la familia, los amigos y otras personas cuya compaa disfrute. Exprese sus sentimientos y converse acerca de las cosas que le preocupan con alguien en quien confe. La orientacin o la psicoterapia con un profesional de salud mental pueden ser de ayuda si tiene dificultades para controlar el estrs usted solo. Siga estas instrucciones en su casa: Estilo de vida  Evite las drogas. No consuma ningn producto que contenga nicotina o tabaco. Estos productos incluyen cigarrillos, tabaco para mascar y aparatos de vapeo, como los cigarrillos electrnicos. Si necesita ayuda para dejar de fumar, consulte al mdico. Si bebe alcohol: Limite la cantidad que bebe a lo siguiente: De 0 a 1 medida por da para las mujeres que no estn embarazadas. De 0 a 2 medidas por da para los hombres. Sepa cunta cantidad de alcohol hay en las bebidas. En los [place], una medida equivale a una botella de cerveza de 12 oz (355 ml), un vaso de vino de 5 oz (148 ml) o un vaso de una bebida alcohlica de alta graduacin de 1 oz (44 ml). No consuma alcohol ni drogas para relajarse. Lleve una dieta equilibrada que incluya frutas y verduras frescas, cereales integrales, carnes magras, pescado, huevos, frijoles y lcteos descremados. Evite los alimentos procesados y los alimentos con alto contenido de grasa, azcar y sal. Haga al menos 30  minutos de ejercicio 5 o ms das de la semana. Intente dormir de 7 a 8 horas todas las noches. Instrucciones generales  Practique tcnicas de control del estrs como se lo haya indicado el mdico. Beba suficiente lquido como para mantener la orina de color amarillo plido. Use los medicamentos de venta libre y los recetados solamente como se lo haya indicado el mdico. Concurra a todas las visitas de seguimiento. Esto es importante. Comunquese con un mdico si: Sus sntomas empeoran. Aparecen nuevos sntomas. Se siente abrumado por los problemas y ya no puede manejarlos solo. Solicite ayuda de inmediato si: Piensa acerca de lastimarse a usted mismo o a otras personas. Busque ayuda de inmediatosi alguna vez siente que puede hacerse dao a usted mismo o a otros, o tiene pensamientos de poner fin a su vida. Dirjase al centro de urgencias ms cercano o: Llame al 911. Llame a National Suicide Prevention Lifeline ([place] [employer] Nacional para la Prevencin del Suicidio) al 1-800-273-8255 o al 988. Est disponible las 24 horas del da. Enve un mensaje de texto a la lnea para casos de crisis al [phone]. Resumen El estrs es una reaccin normal a los sucesos de la vida. Puede causar problemas si es muy frecuente o dura mucho tiempo. La mejor forma de tratar el estrs es practicar las tcnicas de control del estrs. La orientacin o la psicoterapia con un profesional de salud mental pueden ser de ayuda si tiene dificultades para controlar el estrs usted solo. Esta informacin no tiene como fin reemplazar el consejo del mdico. Asegrese de hacerle al mdico cualquier pregunta   que tenga. Document Revised: 02/26/2021 Document Reviewed: 02/26/2021 Elsevier Patient Education  2023 Elsevier Inc.  

## 2022-09-22 NOTE — Assessment & Plan Note (Signed)
Active and affecting quality of life. Creating multiple symptoms including joint pains, headaches, and insomnia. Recommend to start Cymbalta 30 mg daily. Stress management discussed.

## 2022-09-22 NOTE — Assessment & Plan Note (Signed)
Chronic stable condition Continues atorvastatin 10 mg daily. The 10-year ASCVD risk score (Arnett DK, et al., 2019) is: 12.9%   Values used to calculate the score:     Age: 72 years     Sex: Female     Is Non-Hispanic African American: No     Diabetic: No     Tobacco smoker: No     Systolic Blood Pressure: Q000111Q mmHg     Is BP treated: No     HDL Cholesterol: 91.9 mg/dL     Total Cholesterol: 164 mg/dL

## 2022-12-01 ENCOUNTER — Ambulatory Visit (INDEPENDENT_AMBULATORY_CARE_PROVIDER_SITE_OTHER): Payer: Medicare Other

## 2022-12-01 VITALS — BP 122/60 | HR 66 | Temp 97.9°F | Ht 59.0 in | Wt 155.6 lb

## 2022-12-01 DIAGNOSIS — Z0000 Encounter for general adult medical examination without abnormal findings: Secondary | ICD-10-CM | POA: Diagnosis not present

## 2022-12-01 NOTE — Progress Notes (Signed)
Subjective:   Sara [name] Overton Mam is a 72 y.o. female who presents for Medicare Annual (Subsequent) preventive examination.  Review of Systems     Cardiac Risk Factors include: advanced age (>46men, >23 women);dyslipidemia;obesity (BMI >30kg/m2)     Objective:    Today's Vitals   12/01/22 1319 12/01/22 1320  BP: 122/60   Pulse: 66   Temp: 97.9 F (36.6 C)   TempSrc: Temporal   SpO2: 98%   Weight: 155 lb 9.6 oz (70.6 kg)   Height: 4\' 11"  (1.499 m)   PainSc: 6  6   PainLoc: Back    Body mass index is 31.43 kg/m.     12/01/2022    2:14 PM 12/10/2021    2:13 PM 08/13/2021    4:53 PM 11/15/2020    2:48 PM  Advanced Directives  Does Patient Have a Medical Advance Directive? No No No No  Would patient like information on creating a medical advance directive? No - Patient declined No - Patient declined      Current Medications (verified) Outpatient Encounter Medications as of 12/01/2022  Medication Sig   alendronate (FOSAMAX) 70 MG tablet Take 1 tablet (70 mg total) by mouth every 7 (seven) days. Take with a full glass of water on an empty stomach.   atorvastatin (LIPITOR) 10 MG tablet TAKE 1 TABLET BY MOUTH EVERY DAY   CALCIUM PO Take by mouth daily.   DULoxetine (CYMBALTA) 30 MG capsule Take 1 capsule (30 mg total) by mouth daily.   latanoprost (XALATAN) 0.005 % ophthalmic solution Place 1 drop into both eyes at bedtime.   Omega-3 Fatty Acids (FISH OIL PO) Take by mouth daily.   timolol (TIMOPTIC) 0.25 % ophthalmic solution 1 drop every morning.   No facility-administered encounter medications on file as of 12/01/2022.    Allergies (verified) Patient has no known allergies.   History: Past Medical History:  Diagnosis Date   Glaucoma    Situational mixed anxiety and depressive disorder 09/22/2022   Past Surgical History:  Procedure Laterality Date   ABDOMINAL HYSTERECTOMY     CHOLECYSTECTOMY     Family History  Problem Relation Age of Onset   Headache Neg Hx     Social History   Socioeconomic History   Marital status: Married    Spouse name: Not on file   Number of children: 1   Years of education: Not on file   Highest education level: Not on file  Occupational History   Not on file  Tobacco Use   Smoking status: Never   Smokeless tobacco: Never  Vaping Use   Vaping Use: Never used  Substance and Sexual Activity   Alcohol use: Yes    Comment: occ   Drug use: Never   Sexual activity: Yes  Other Topics Concern   Not on file  Social History Narrative   Not on file   Social Determinants of Health   Financial Resource Strain: Low Risk  (12/01/2022)   Overall Financial Resource Strain (CARDIA)    Difficulty of Paying Living Expenses: Not hard at all  Food Insecurity: No Food Insecurity (12/01/2022)   Hunger Vital Sign    Worried About Running Out of Food in the Last Year: Never true    Ran Out of Food in the Last Year: Never true  Transportation Needs: No Transportation Needs (12/01/2022)   PRAPARE - Administrator, Civil Service (Medical): No    Lack of Transportation (Non-Medical): No  Physical Activity: Sufficiently Active (12/01/2022)   Exercise Vital Sign    Days of Exercise per Week: 7 days    Minutes of Exercise per Session: 30 min  Recent Concern: Physical Activity - Inactive (12/01/2022)   Exercise Vital Sign    Days of Exercise per Week: 0 days    Minutes of Exercise per Session: 0 min  Stress: No Stress Concern Present (12/01/2022)   [employer] of Occupational Health - Occupational Stress Questionnaire    Feeling of Stress : Not at all  Social Connections: Moderately Integrated (12/01/2022)   Social Connection and Isolation Panel [NHANES]    Frequency of Communication with Friends and Family: More than three times a week    Frequency of Social Gatherings with Friends and Family: More than three times a week    Attends Religious Services: More than 4 times per year    Active Member of [employer] or Organizations:  No    Attends Engineer, structural: Never    Marital Status: Married    Tobacco Counseling Counseling given: Not Answered   Clinical Intake:  Pre-visit preparation completed: Yes  Pain : 0-10 Pain Score: 6  Pain Location: Neck Pain Radiating Towards: back     BMI - recorded: 31.43 Nutritional Status: BMI > 30  Obese Nutritional Risks: None Diabetes: No  How often do you need to have someone help you when you read instructions, pamphlets, or other written materials from your doctor or pharmacy?: 1 - Never What is the last grade level you completed in school?: HSG  Diabetic? No  Interpreter Needed?: No  Information entered by :: Lorien Shingler N. Cervando Durnin, LPN.   Activities of Daily Living    12/01/2022    2:15 PM 12/10/2021    2:28 PM  In your present state of health, do you have any difficulty performing the following activities:  Hearing? 0 0  Vision? 0 1  Difficulty concentrating or making decisions? 0 0  Walking or climbing stairs? 0 0  Dressing or bathing? 0 0  Doing errands, shopping? 0 0  Preparing Food and eating ? N N  Using the Toilet? N N  In the past six months, have you accidently leaked urine? N N  Do you have problems with loss of bowel control? N N  Managing your Medications? N N  Managing your Finances? N N  Housekeeping or managing your Housekeeping? N N    Patient Care Team: Georgina Quint, MD as PCP - General (Internal Medicine) Steva Ready, DO as Consulting Physician (Obstetrics and Gynecology) Loree Fee as Consulting Physician (Optometry)  Indicate any recent Medical Services you may have received from other than [facility] providers in the past year (date may be approximate).     Assessment:   This is a routine wellness examination for Pa.  Hearing/Vision screen Hearing Screening - Comments:: Denies hearing difficulties   Vision Screening - Comments:: Wears rx glasses - up to date with routine eye exams with  Loree Fee, MD every 6 months for glaucoma and cataracts.   Dietary issues and exercise activities discussed: Current Exercise Habits: Home exercise routine, Type of exercise: walking, Time (Minutes): 30, Frequency (Times/Week): 7, Weekly Exercise (Minutes/Week): 210, Intensity: Moderate, Exercise limited by: None identified   Goals Addressed             This Visit's Progress    My goal for 2024 is to maintain my health and eat healthy.        Depression  Screen    12/01/2022    1:20 PM 09/22/2022    1:18 PM 03/24/2022    1:43 PM 03/24/2022    1:39 PM 12/10/2021    2:16 PM 08/29/2021    9:28 AM 07/17/2021    8:07 AM  PHQ 2/9 Scores  PHQ - 2 Score 1 0 0 0 0 2 0  PHQ- 9 Score 1 0 0   10     Fall Risk    12/01/2022    2:15 PM 09/22/2022    1:18 PM 03/24/2022    1:39 PM 07/17/2021    8:07 AM  Fall Risk   Falls in the past year? 0 0 0 0  Number falls in past yr: 0 0 0 0  Injury with Fall? 0 0 0 0  Risk for fall due to : No Fall Risks No Fall Risks No Fall Risks   Follow up Falls prevention discussed Falls evaluation completed Falls evaluation completed Falls evaluation completed    FALL RISK PREVENTION PERTAINING TO THE HOME:  Any stairs in or around the home? No  If so, are there any without handrails? No  Home free of loose throw rugs in walkways, pet beds, electrical cords, etc? Yes  Adequate lighting in your home to reduce risk of falls? Yes   ASSISTIVE DEVICES UTILIZED TO PREVENT FALLS:  Life alert? Yes  (Apple Watch) Use of a cane, walker or w/c? No  Grab bars in the bathroom? No  Shower chair or bench in shower? No  Elevated toilet seat or a handicapped toilet? No   TIMED UP AND GO:  Was the test performed? Yes .  Length of time to ambulate 10 feet: 9 sec.   Gait steady and fast without use of assistive device  Cognitive Function:        12/10/2021    2:31 PM  6CIT Screen  What Year? 0 points  What month? 0 points  What time? 0 points  Count back  from 20 0 points  Months in reverse 0 points  Repeat phrase 10 points  Total Score 10 points    Immunizations Immunization History  Administered Date(s) Administered   PFIZER(Purple Top)SARS-COV-2 Vaccination 10/05/2019, 10/27/2019, 05/19/2020, 10/08/2020   PNEUMOCOCCAL CONJUGATE-20 03/24/2022    TDAP status: Due, Education has been provided regarding the importance of this vaccine. Advised may receive this vaccine at local pharmacy or Health Dept. Aware to provide a copy of the vaccination record if obtained from local pharmacy or Health Dept. Verbalized acceptance and understanding.  Flu Vaccine status: Declined, Education has been provided regarding the importance of this vaccine but patient still declined. Advised may receive this vaccine at local pharmacy or Health Dept. Aware to provide a copy of the vaccination record if obtained from local pharmacy or Health Dept. Verbalized acceptance and understanding.  Pneumococcal vaccine status: Up to date  Covid-19 vaccine status: Completed vaccines  Qualifies for Shingles Vaccine? Yes   Zostavax completed No   Shingrix Completed?: No.    Education has been provided regarding the importance of this vaccine. Patient has been advised to call insurance company to determine out of pocket expense if they have not yet received this vaccine. Advised may also receive vaccine at local pharmacy or Health Dept. Verbalized acceptance and understanding.  Screening Tests Health Maintenance  Topic Date Due   Hepatitis C Screening  Never done   Colonoscopy  Never done   Zoster Vaccines- Shingrix (1 of 2) Never done  COVID-19 Vaccine (5 - 2023-24 season) 02/27/2022   INFLUENZA VACCINE  01/28/2023   MAMMOGRAM  03/18/2023   Medicare Annual Wellness (AWV)  12/01/2023   Pneumonia Vaccine 76+ Years old  Completed   DEXA SCAN  Completed   HPV VACCINES  Aged Out   DTaP/Tdap/Td  Discontinued    Health Maintenance  Health Maintenance Due  Topic Date  Due   Hepatitis C Screening  Never done   Colonoscopy  Never done   Zoster Vaccines- Shingrix (1 of 2) Never done   COVID-19 Vaccine (5 - 2023-24 season) 02/27/2022    Colorectal cancer screening: No longer required.   Mammogram status: Completed 03/17/2021. Repeat every year Scheduled for 02/2022  Bone Density status: Completed 01/16/2021. Results reflect: Bone density results: OSTEOPOROSIS. Repeat every 2 years.  Lung Cancer Screening: (Low Dose CT Chest recommended if Age 86-80 years, 30 pack-year currently smoking OR have quit w/in 15years.) does not qualify.   Lung Cancer Screening Referral: no  Additional Screening:  Hepatitis C Screening: does qualify; Completed: no  Vision Screening: Recommended annual ophthalmology exams for early detection of glaucoma and other disorders of the eye. Is the patient up to date with their annual eye exam?  Yes  Who is the provider or what is the name of the office in which the patient attends annual eye exams? Loree Fee, MD. If pt is not established with a provider, would they like to be referred to a provider to establish care? No .   Dental Screening: Recommended annual dental exams for proper oral hygiene  Community Resource Referral / Chronic Care Management: CRR required this visit?  No   CCM required this visit?  No      Plan:     I have personally reviewed and noted the following in the patient's chart:   Medical and social history Use of alcohol, tobacco or illicit drugs  Current medications and supplements including opioid prescriptions. Patient is not currently taking opioid prescriptions. Functional ability and status Nutritional status Physical activity Advanced directives List of other physicians Hospitalizations, surgeries, and ER visits in previous 12 months Vitals Screenings to include cognitive, depression, and falls Referrals and appointments  In addition, I have reviewed and discussed with patient  certain preventive protocols, quality metrics, and best practice recommendations. A written personalized care plan for preventive services as well as general preventive health recommendations were provided to patient.     Mickeal Needy, LPN   07/04/1094   Nurse Notes: Normal cognitive status assessed by direct observation by this Nurse Health Advisor. No abnormalities found.

## 2022-12-01 NOTE — Patient Instructions (Addendum)
[name] , Thank you for taking time to come for your Medicare Wellness Visit. I appreciate your ongoing commitment to your health goals. Please review the following plan we discussed and let me know if I can assist you in the future.   These are the goals we discussed:  Goals      My goal for 2024 is to maintain my health and eat healthy.        This is a list of the screening recommended for you and due dates:  Health Maintenance  Topic Date Due   Hepatitis C Screening  Never done   Colon Cancer Screening  Never done   Zoster (Shingles) Vaccine (1 of 2) Never done   COVID-19 Vaccine (5 - 2023-24 season) 02/27/2022   Flu Shot  01/28/2023   Mammogram  03/18/2023   Medicare Annual Wellness Visit  12/01/2023   Pneumonia Vaccine  Completed   DEXA scan (bone density measurement)  Completed   HPV Vaccine  Aged Out   DTaP/Tdap/Td vaccine  Discontinued    Advanced directives: No  Conditions/risks identified: Yes  Next appointment: Follow up in one year for your annual wellness visit.   Preventive Care 36 Years and Older, Female Preventive care refers to lifestyle choices and visits with your health care provider that can promote health and wellness. What does preventive care include? A yearly physical exam. This is also called an annual well check. Dental exams once or twice a year. Routine eye exams. Ask your health care provider how often you should have your eyes checked. Personal lifestyle choices, including: Daily care of your teeth and gums. Regular physical activity. Eating a healthy diet. Avoiding tobacco and drug use. Limiting alcohol use. Practicing safe sex. Taking low-dose aspirin every day. Taking vitamin and mineral supplements as recommended by your health care provider. What happens during an annual well check? The services and screenings done by your health care provider during your annual well check will depend on your age, overall health,  lifestyle risk factors, and family history of disease. Counseling  Your health care provider may ask you questions about your: Alcohol use. Tobacco use. Drug use. Emotional well-being. Home and relationship well-being. Sexual activity. Eating habits. History of falls. Memory and ability to understand (cognition). Work and work Astronomer. Reproductive health. Screening  You may have the following tests or measurements: Height, weight, and BMI. Blood pressure. Lipid and cholesterol levels. These may be checked every 5 years, or more frequently if you are over 17 years old. Skin check. Lung cancer screening. You may have this screening every year starting at age 63 if you have a 30-pack-year history of smoking and currently smoke or have quit within the past 15 years. Fecal occult blood test (FOBT) of the stool. You may have this test every year starting at age 40. Flexible sigmoidoscopy or colonoscopy. You may have a sigmoidoscopy every 5 years or a colonoscopy every 10 years starting at age 2. Hepatitis C blood test. Hepatitis B blood test. Sexually transmitted disease (STD) testing. Diabetes screening. This is done by checking your blood sugar (glucose) after you have not eaten for a while (fasting). You may have this done every 1-3 years. Bone density scan. This is done to screen for osteoporosis. You may have this done starting at age 35. Mammogram. This may be done every 1-2 years. Talk to your health care provider about how often you should have regular mammograms. Talk with your health care  provider about your test results, treatment options, and if necessary, the need for more tests. Vaccines  Your health care provider may recommend certain vaccines, such as: Influenza vaccine. This is recommended every year. Tetanus, diphtheria, and acellular pertussis (Tdap, Td) vaccine. You may need a Td booster every 10 years. Zoster vaccine. You may need this after age  34. Pneumococcal 13-valent conjugate (PCV13) vaccine. One dose is recommended after age 66. Pneumococcal polysaccharide (PPSV23) vaccine. One dose is recommended after age 21. Talk to your health care provider about which screenings and vaccines you need and how often you need them. This information is not intended to replace advice given to you by your health care provider. Make sure you discuss any questions you have with your health care provider. Document Released: 07/12/2015 Document Revised: 03/04/2016 Document Reviewed: 04/16/2015 Elsevier Interactive Patient Education  2017 [employer].  Fall Prevention in the Home Falls can cause injuries. They can happen to people of all ages. There are many things you can do to make your home safe and to help prevent falls. What can I do on the outside of my home? Regularly fix the edges of walkways and driveways and fix any cracks. Remove anything that might make you trip as you walk through a door, such as a raised step or threshold. Trim any bushes or trees on the path to your home. Use bright outdoor lighting. Clear any walking paths of anything that might make someone trip, such as rocks or tools. Regularly check to see if handrails are loose or broken. Make sure that both sides of any steps have handrails. Any raised decks and porches should have guardrails on the edges. Have any leaves, snow, or ice cleared regularly. Use sand or salt on walking paths during winter. Clean up any spills in your garage right away. This includes oil or grease spills. What can I do in the bathroom? Use night lights. Install grab bars by the toilet and in the tub and shower. Do not use towel bars as grab bars. Use non-skid mats or decals in the tub or shower. If you need to sit down in the shower, use a plastic, non-slip stool. Keep the floor dry. Clean up any water that spills on the floor as soon as it happens. Remove soap buildup in the tub or shower  regularly. Attach bath mats securely with double-sided non-slip rug tape. Do not have throw rugs and other things on the floor that can make you trip. What can I do in the bedroom? Use night lights. Make sure that you have a light by your bed that is easy to reach. Do not use any sheets or blankets that are too big for your bed. They should not hang down onto the floor. Have a firm chair that has side arms. You can use this for support while you get dressed. Do not have throw rugs and other things on the floor that can make you trip. What can I do in the kitchen? Clean up any spills right away. Avoid walking on wet floors. Keep items that you use a lot in easy-to-reach places. If you need to reach something above you, use a strong step stool that has a grab bar. Keep electrical cords out of the way. Do not use floor polish or wax that makes floors slippery. If you must use wax, use non-skid floor wax. Do not have throw rugs and other things on the floor that can make you trip. What can I  do with my stairs? Do not leave any items on the stairs. Make sure that there are handrails on both sides of the stairs and use them. Fix handrails that are broken or loose. Make sure that handrails are as long as the stairways. Check any carpeting to make sure that it is firmly attached to the stairs. Fix any carpet that is loose or worn. Avoid having throw rugs at the top or bottom of the stairs. If you do have throw rugs, attach them to the floor with carpet tape. Make sure that you have a light switch at the top of the stairs and the bottom of the stairs. If you do not have them, ask someone to add them for you. What else can I do to help prevent falls? Wear shoes that: Do not have high heels. Have rubber bottoms. Are comfortable and fit you well. Are closed at the toe. Do not wear sandals. If you use a stepladder: Make sure that it is fully opened. Do not climb a closed stepladder. Make sure that  both sides of the stepladder are locked into place. Ask someone to hold it for you, if possible. Clearly mark and make sure that you can see: Any grab bars or handrails. First and last steps. Where the edge of each step is. Use tools that help you move around (mobility aids) if they are needed. These include: Canes. Walkers. Scooters. Crutches. Turn on the lights when you go into a dark area. Replace any light bulbs as soon as they burn out. Set up your furniture so you have a clear path. Avoid moving your furniture around. If any of your floors are uneven, fix them. If there are any pets around you, be aware of where they are. Review your medicines with your doctor. Some medicines can make you feel dizzy. This can increase your chance of falling. Ask your doctor what other things that you can do to help prevent falls. This information is not intended to replace advice given to you by your health care provider. Make sure you discuss any questions you have with your health care provider. Document Released: 04/11/2009 Document Revised: 11/21/2015 Document Reviewed: 07/20/2014 Elsevier Interactive Patient Education  2017 [employer].

## 2022-12-15 ENCOUNTER — Ambulatory Visit: Payer: Medicare Other

## 2023-01-25 ENCOUNTER — Other Ambulatory Visit: Payer: Self-pay

## 2023-01-25 ENCOUNTER — Other Ambulatory Visit: Payer: Self-pay | Admitting: Medical

## 2023-01-25 MED ORDER — ATORVASTATIN CALCIUM 10 MG PO TABS: 10.0000 mg | ORAL_TABLET | Freq: Every day | ORAL | 1 refills | Status: DC

## 2023-01-27 ENCOUNTER — Other Ambulatory Visit: Payer: Self-pay | Admitting: *Deleted

## 2023-01-27 DIAGNOSIS — M810 Age-related osteoporosis without current pathological fracture: Secondary | ICD-10-CM

## 2023-02-11 ENCOUNTER — Encounter (INDEPENDENT_AMBULATORY_CARE_PROVIDER_SITE_OTHER): Payer: Self-pay

## 2023-03-02 DIAGNOSIS — M25561 Pain in right knee: Secondary | ICD-10-CM | POA: Diagnosis not present

## 2023-03-29 ENCOUNTER — Encounter: Payer: Medicare Other | Admitting: Emergency Medicine

## 2023-04-05 ENCOUNTER — Ambulatory Visit (INDEPENDENT_AMBULATORY_CARE_PROVIDER_SITE_OTHER): Payer: Medicare Other | Admitting: Emergency Medicine

## 2023-04-05 ENCOUNTER — Encounter: Payer: Self-pay | Admitting: Emergency Medicine

## 2023-04-05 VITALS — BP 122/64 | HR 68 | Temp 98.1°F | Ht 59.0 in | Wt 155.1 lb

## 2023-04-05 DIAGNOSIS — R9431 Abnormal electrocardiogram [ECG] [EKG]: Secondary | ICD-10-CM | POA: Insufficient documentation

## 2023-04-05 DIAGNOSIS — M810 Age-related osteoporosis without current pathological fracture: Secondary | ICD-10-CM | POA: Diagnosis not present

## 2023-04-05 DIAGNOSIS — Z0000 Encounter for general adult medical examination without abnormal findings: Secondary | ICD-10-CM

## 2023-04-05 DIAGNOSIS — Z1329 Encounter for screening for other suspected endocrine disorder: Secondary | ICD-10-CM | POA: Diagnosis not present

## 2023-04-05 DIAGNOSIS — Z130 Encounter for screening for diseases of the blood and blood-forming organs and certain disorders involving the immune mechanism: Secondary | ICD-10-CM

## 2023-04-05 DIAGNOSIS — Z0001 Encounter for general adult medical examination with abnormal findings: Secondary | ICD-10-CM | POA: Diagnosis not present

## 2023-04-05 DIAGNOSIS — R079 Chest pain, unspecified: Secondary | ICD-10-CM | POA: Insufficient documentation

## 2023-04-05 DIAGNOSIS — Z13220 Encounter for screening for lipoid disorders: Secondary | ICD-10-CM | POA: Diagnosis not present

## 2023-04-05 DIAGNOSIS — E785 Hyperlipidemia, unspecified: Secondary | ICD-10-CM | POA: Diagnosis not present

## 2023-04-05 DIAGNOSIS — Z13228 Encounter for screening for other metabolic disorders: Secondary | ICD-10-CM

## 2023-04-05 DIAGNOSIS — Z1159 Encounter for screening for other viral diseases: Secondary | ICD-10-CM

## 2023-04-05 DIAGNOSIS — Z1211 Encounter for screening for malignant neoplasm of colon: Secondary | ICD-10-CM

## 2023-04-05 LAB — CBC WITH DIFFERENTIAL/PLATELET
Basophils Absolute: 0 10*3/uL (ref 0.0–0.1)
Basophils Relative: 0.6 % (ref 0.0–3.0)
Eosinophils Absolute: 0.1 10*3/uL (ref 0.0–0.7)
Eosinophils Relative: 2 % (ref 0.0–5.0)
HCT: 43.4 % (ref 36.0–46.0)
Hemoglobin: 14.2 g/dL (ref 12.0–15.0)
Lymphocytes Relative: 41.9 % (ref 12.0–46.0)
Lymphs Abs: 2.2 10*3/uL (ref 0.7–4.0)
MCHC: 32.6 g/dL (ref 30.0–36.0)
MCV: 96.6 fL (ref 78.0–100.0)
Monocytes Absolute: 0.4 10*3/uL (ref 0.1–1.0)
Monocytes Relative: 7.6 % (ref 3.0–12.0)
Neutro Abs: 2.5 10*3/uL (ref 1.4–7.7)
Neutrophils Relative %: 47.9 % (ref 43.0–77.0)
Platelets: 267 10*3/uL (ref 150.0–400.0)
RBC: 4.49 Mil/uL (ref 3.87–5.11)
RDW: 13.3 % (ref 11.5–15.5)
WBC: 5.2 10*3/uL (ref 4.0–10.5)

## 2023-04-05 LAB — HEMOGLOBIN A1C: Hgb A1c MFr Bld: 5.6 % (ref 4.6–6.5)

## 2023-04-05 NOTE — Patient Instructions (Signed)

## 2023-04-05 NOTE — Progress Notes (Signed)
Sara [name] 72 y.o.   Chief Complaint  Patient presents with   Annual Exam    Patient states last month she had some chest pain that radiated to her back and shoulder, left side.   Last night she had pain during the night, chest pain, pinch feeling     HISTORY OF PRESENT ILLNESS: This is a 72 y.o. female here for annual exam Also complaining of left-sided chest pressure episode last month that lasted about 5 minutes with radiation down left arm.  It started at work without any other associated symptoms.  Had sharp brief episodes of left-sided chest pain last night different than previous episodes. No other complaints or medical concerns today. Scheduled for mammogram later this month.  HPI   Prior to Admission medications   Medication Sig Start Date End Date Taking? Authorizing Provider  alendronate (FOSAMAX) 70 MG tablet Take 1 tablet (70 mg total) by mouth every 7 (seven) days. Take with a full glass of water on an empty stomach. 03/24/22  Yes Glena Pharris, Eilleen Kempf, MD  atorvastatin (LIPITOR) 10 MG tablet Take 1 tablet (10 mg total) by mouth daily. 01/25/23  Yes Saguier, Ramon Dredge, PA-C  CALCIUM PO Take by mouth daily.   Yes [provider]  DULoxetine (CYMBALTA) 30 MG capsule Take 1 capsule (30 mg total) by mouth daily. 09/22/22  Yes Jenel Gierke, Eilleen Kempf, MD  latanoprost (XALATAN) 0.005 % ophthalmic solution Place 1 drop into both eyes at bedtime. 01/22/21  Yes [provider]  Omega-3 Fatty Acids (FISH OIL PO) Take by mouth daily.   Yes [provider]  timolol (TIMOPTIC) 0.25 % ophthalmic solution 1 drop every morning. 10/19/22  Yes [provider]    No Known Allergies  Patient Active Problem List   Diagnosis Date Noted   Situational mixed anxiety and depressive disorder 09/22/2022   Age-related osteoporosis without current pathological fracture 03/24/2022   Dyslipidemia 03/24/2022    Past Medical History:  Diagnosis Date    Glaucoma    Situational mixed anxiety and depressive disorder 09/22/2022    Past Surgical History:  Procedure Laterality Date   ABDOMINAL HYSTERECTOMY     CHOLECYSTECTOMY      Social History   Socioeconomic History   Marital status: Married    Spouse name: Not on file   Number of children: 1   Years of education: Not on file   Highest education level: Not on file  Occupational History   Not on file  Tobacco Use   Smoking status: Never   Smokeless tobacco: Never  Vaping Use   Vaping status: Never Used  Substance and Sexual Activity   Alcohol use: Yes    Comment: occ   Drug use: Never   Sexual activity: Yes  Other Topics Concern   Not on file  Social History Narrative   Not on file   Social Determinants of Health   Financial Resource Strain: Low Risk  (12/01/2022)   Overall Financial Resource Strain (CARDIA)    Difficulty of Paying Living Expenses: Not hard at all  Food Insecurity: No Food Insecurity (12/01/2022)   Hunger Vital Sign    Worried About Running Out of Food in the Last Year: Never true    Ran Out of Food in the Last Year: Never true  Transportation Needs: No Transportation Needs (12/01/2022)   PRAPARE - Administrator, Civil Service (Medical): No    Lack of Transportation (Non-Medical): No  Physical Activity: Sufficiently  Active (12/01/2022)   Exercise Vital Sign    Days of Exercise per Week: 7 days    Minutes of Exercise per Session: 30 min  Recent Concern: Physical Activity - Inactive (12/01/2022)   Exercise Vital Sign    Days of Exercise per Week: 0 days    Minutes of Exercise per Session: 0 min  Stress: No Stress Concern Present (12/01/2022)   [employer] of Occupational Health - Occupational Stress Questionnaire    Feeling of Stress : Not at all  Social Connections: Moderately Integrated (12/01/2022)   Social Connection and Isolation Panel [NHANES]    Frequency of Communication with Friends and Family: More than three times a week     Frequency of Social Gatherings with Friends and Family: More than three times a week    Attends Religious Services: More than 4 times per year    Active Member of [employer] or Organizations: No    Attends Banker Meetings: Never    Marital Status: Married  Catering manager Violence: Not At Risk (12/01/2022)   Humiliation, Afraid, Rape, and Kick questionnaire    Fear of Current or Ex-Partner: No    Emotionally Abused: No    Physically Abused: No    Sexually Abused: No    Family History  Problem Relation Age of Onset   Headache Neg Hx      Review of Systems  Constitutional: Negative.  Negative for chills and fever.  HENT: Negative.  Negative for congestion and sore throat.   Respiratory: Negative.  Negative for cough and shortness of breath.   Cardiovascular:  Positive for chest pain.  Gastrointestinal:  Negative for abdominal pain, diarrhea, nausea and vomiting.  Genitourinary: Negative.  Negative for dysuria and hematuria.  Skin: Negative.  Negative for rash.  Neurological: Negative.  Negative for dizziness and headaches.  All other systems reviewed and are negative.   Vitals:   04/05/23 1435  BP: 122/64  Pulse: 68  Temp: 98.1 F (36.7 C)  SpO2: 97%    Physical Exam Vitals reviewed.  Constitutional:      Appearance: Normal appearance.  HENT:     Head: Normocephalic.     Right Ear: Tympanic membrane, ear canal and external ear normal.     Left Ear: Tympanic membrane, ear canal and external ear normal.     Mouth/Throat:     Mouth: Mucous membranes are moist.     Pharynx: Oropharynx is clear.  Eyes:     Extraocular Movements: Extraocular movements intact.     Conjunctiva/sclera: Conjunctivae normal.     Pupils: Pupils are equal, round, and reactive to light.  Cardiovascular:     Rate and Rhythm: Normal rate and regular rhythm.     Pulses: Normal pulses.     Heart sounds: Normal heart sounds.  Pulmonary:     Effort: Pulmonary effort is normal.      Breath sounds: Normal breath sounds.  Abdominal:     Palpations: Abdomen is soft.     Tenderness: There is no abdominal tenderness.  Musculoskeletal:     Cervical back: No tenderness.  Lymphadenopathy:     Cervical: No cervical adenopathy.  Skin:    General: Skin is warm and dry.     Capillary Refill: Capillary refill takes less than 2 seconds.  Neurological:     General: No focal deficit present.     Mental Status: She is alert and oriented to person, place, and time.  Psychiatric:  Mood and Affect: Mood normal.        Behavior: Behavior normal.    EKG: Normal sinus rhythm with ventricular rate of 72/min.  Incomplete right bundle branch block.  Inverted T waves in 2 3 and aVF.  Biphasic T waves in V3 and V4 Abnormal EKG  ASSESSMENT & PLAN: Problem List Items Addressed This Visit       Musculoskeletal and Integument   Age-related osteoporosis without current pathological fracture    Clinically stable.  No recent fractures. Continues Fosamax 70 mg weekly        Other   Dyslipidemia    Diet and nutrition discussed Lipid profile done today Continue atorvastatin 10 mg daily The 10-year ASCVD risk score (Arnett DK, et al., 2019) is: 12.8%   Values used to calculate the score:     Age: 56 years     Sex: Female     Is Non-Hispanic African American: No     Diabetic: No     Tobacco smoker: No     Systolic Blood Pressure: 122 mmHg     Is BP treated: No     HDL Cholesterol: 91.9 mg/dL     Total Cholesterol: 164 mg/dL       Nonspecific chest pain    Clinically stable.  No red flag signs or symptoms. Benign physical examination. Abnormal EKG. Has several risk factors for CAD Recommend cardiology evaluation. Referral placed today. ED precautions given      Relevant Orders   EKG 12-Lead   Ambulatory referral to Cardiology   CBC with Differential   Comprehensive metabolic panel   Hemoglobin A1c   Lipid panel   Abnormal EKG    Recommend cardiology  evaluation Referral placed today      Relevant Orders   Ambulatory referral to Cardiology   Other Visit Diagnoses     Encounter for general adult medical examination with abnormal findings    -  Primary   Relevant Orders   CBC with Differential   Comprehensive metabolic panel   Hemoglobin A1c   Lipid panel   Hepatitis C antibody screen   Need for hepatitis C screening test       Relevant Orders   Hepatitis C antibody screen   Colon cancer screening       Relevant Orders   Ambulatory referral to Gastroenterology   Screening for deficiency anemia       Relevant Orders   CBC with Differential   Screening for lipoid disorders       Relevant Orders   Lipid panel   Screening for endocrine, metabolic and immunity disorder       Relevant Orders   Comprehensive metabolic panel   Hemoglobin A1c      Modifiable risk factors discussed with patient. Anticipatory guidance according to age provided. The following topics were also discussed: Social Determinants of Health Smoking.  Non-smoker Diet and nutrition Benefits of exercise Cancer screening and need for colon cancer screening with colonoscopy Vaccinations review and recommendations Cardiovascular risk assessment The 10-year ASCVD risk score (Arnett DK, et al., 2019) is: 12.8%   Values used to calculate the score:     Age: 60 years     Sex: Female     Is Non-Hispanic African American: No     Diabetic: No     Tobacco smoker: No     Systolic Blood Pressure: 122 mmHg     Is BP treated: No     HDL Cholesterol: 91.9 mg/dL  Total Cholesterol: 164 mg/dL Review of chronic medical problems under management Review of all medications Mental health including depression and anxiety Fall and accident prevention  Patient Instructions  Mantenimiento de Radiographer, therapeutic en las mujeres Health Maintenance, Female Adoptar un estilo de vida saludable y recibir atencin preventiva son importantes para promover la salud y Counsellor.  Consulte al mdico sobre: El esquema adecuado para hacerse pruebas y exmenes peridicos. Cosas que puede hacer por su cuenta para prevenir enfermedades y [place] sano. Qu debo saber sobre la dieta, el peso y el ejercicio? Consuma una dieta saludable  Consuma una dieta que incluya muchas verduras, frutas, productos lcteos con bajo contenido de [place] y Associate Professor. No consuma muchos alimentos ricos en grasas slidas, azcares agregados o sodio. Mantenga un peso saludable El ndice de masa muscular [facility]) se [place] para identificar problemas de [place]. Proporciona una estimacin de la grasa corporal basndose en el peso y la altura. Su mdico puede ayudarle a Engineer, site IMC y a Personnel officer o Pharmacologist un peso saludable. Haga ejercicio con regularidad Haga ejercicio con regularidad. Esta es una de las prcticas ms importantes que puede hacer por su salud. La [employer] de los adultos deben seguir estas pautas: Education officer, environmental, al menos, 150 minutos de actividad fsica por semana. El ejercicio debe aumentar la frecuencia cardaca y Media planner transpirar (ejercicio de intensidad moderada). Hacer ejercicios de fortalecimiento por lo [employer] por semana. Agregue esto a su plan de ejercicio de intensidad moderada. Pase menos tiempo sentada. Incluso la actividad fsica ligera puede ser beneficiosa. Controle sus niveles de colesterol y lpidos en la sangre Comience a realizarse anlisis de lpidos y Oncologist en la sangre a los 20 aos y luego reptalos cada 5 aos. Hgase controlar los niveles de colesterol con mayor frecuencia si: Sus niveles de lpidos y colesterol son altos. Es mayor de 40 aos. Presenta un alto riesgo de padecer enfermedades cardacas. Qu debo saber sobre las pruebas de deteccin del cncer? Segn su historia clnica y sus antecedentes familiares, es posible que deba realizarse pruebas de deteccin del cncer en diferentes edades. Esto puede incluir pruebas de deteccin de lo  siguiente: Cncer de mama. Cncer de cuello uterino. Cncer colorrectal. Cncer de piel. Cncer de pulmn. Qu debo saber sobre la enfermedad cardaca, la diabetes y la hipertensin arterial? Presin arterial y enfermedad cardaca La hipertensin arterial causa enfermedades cardacas y [place] el riesgo de accidente cerebrovascular. Es ms probable que esto se manifieste en las personas que tienen lecturas de presin arterial alta o tienen sobrepeso. Hgase controlar la presin arterial: Cada 3 a 5 aos si tiene entre 18 y 36 aos. Todos los aos si es mayor de 40 aos. Diabetes Realcese exmenes de deteccin de la diabetes con regularidad. Este anlisis revisa el nivel de azcar en la sangre en [place]. Hgase las pruebas de deteccin: Cada tres aos despus de los 40 aos de edad si tiene un peso normal y un bajo riesgo de padecer diabetes. Con ms frecuencia y a partir de [place] edad inferior si tiene sobrepeso o un alto riesgo de padecer diabetes. Qu debo saber sobre la prevencin de infecciones? Hepatitis B Si tiene un riesgo ms alto de contraer hepatitis B, debe someterse a un examen de deteccin de este virus. Hable con el mdico para averiguar si tiene riesgo de contraer la infeccin por hepatitis B. Hepatitis C Se recomienda el anlisis a: [employer] 1945 y 1965. Todas las personas que tengan un riesgo de Production manager  contrado hepatitis C. Enfermedades de transmisin sexual (ETS) Hgase las pruebas de Airline pilot de ITS, incluidas la gonorrea y la clamidia, si: Es sexualmente activa y es menor de [address]. Es mayor de [address], y Public affairs consultant informa que corre riesgo de tener este tipo de infecciones. La actividad sexual ha cambiado desde que le hicieron la ltima prueba de deteccin y tiene un riesgo mayor de Warehouse manager clamidia o Copy. Pregntele al mdico si usted tiene riesgo. Pregntele al mdico si usted tiene un alto riesgo de Primary school teacher VIH. El mdico tambin puede  recomendarle un medicamento recetado para ayudar a evitar la infeccin por el VIH. Si elige tomar medicamentos para prevenir el VIH, primero debe [employer] de deteccin del VIH. Luego debe hacerse anlisis cada 3 meses mientras est tomando los medicamentos. Embarazo Si est por dejar de Armed forces training and education officer (fase premenopusica) y usted puede quedar [place], busque asesoramiento antes de [place]. [place] de 400 a 800 microgramos (mcg) de cido [employer] si [place]. Pida mtodos de control de la natalidad (anticonceptivos) si desea evitar un embarazo no deseado. Osteoporosis y [place] La osteoporosis es una enfermedad en la que los huesos pierden los minerales y la fuerza por el avance de la edad. El resultado pueden ser fracturas en los [place]. Si tiene 65 aos o ms, o si est en riesgo de sufrir osteoporosis y fracturas, pregunte a su mdico si debe: Hacerse pruebas de deteccin de prdida sea. Tomar un suplemento de calcio o de vitamina D para reducir el riesgo de fracturas. Recibir terapia de reemplazo hormonal (TRH) para tratar los sntomas de la menopausia. Siga estas indicaciones en su casa: Consumo de alcohol No beba alcohol si: Su mdico le indica no hacerlo. Est embarazada, puede estar embarazada o est tratando de [place]. Si bebe alcohol: Limite la cantidad que bebe a lo siguiente: De 0 a 1 bebida por da. Sepa cunta cantidad de alcohol hay en las bebidas que toma. En los [address], una medida equivale a una botella de cerveza de 12 oz (355 ml), un vaso de vino de 5 oz (148 ml) o un vaso de una bebida alcohlica de alta graduacin de 1 oz (44 ml). Estilo de vida No consuma ningn producto que contenga nicotina o tabaco. Estos productos incluyen cigarrillos, tabaco para Theatre manager y aparatos de vapeo, como los Administrator, Civil Service. Si necesita ayuda para dejar de consumir estos productos, consulte al mdico. No consuma drogas. No  comparta agujas. Solicite ayuda a su mdico si necesita apoyo o informacin para abandonar las drogas. Indicaciones generales Realcese los estudios de rutina de [address], dentales y de Wellsite geologist. Mantngase al da con las vacunas. Infrmele a su mdico si: Se siente deprimida con frecuencia. Alguna vez ha sido vctima de [place] o no se siente seguro en su casa. Resumen Adoptar un estilo de vida saludable y recibir atencin preventiva son importantes para promover la salud y Counsellor. Siga las instrucciones del mdico acerca de una dieta saludable, el ejercicio y la realizacin de pruebas o exmenes para Hotel manager. Siga las instrucciones del mdico con respecto al control del colesterol y la presin arterial. Esta informacin no tiene Theme park manager el consejo del mdico. Asegrese de hacerle al mdico cualquier pregunta que tenga. Document Revised: 11/21/2020 Document Reviewed: 11/21/2020 Elsevier Patient Education  2024 Elsevier Inc.     Edwina Barth, MD [facility]

## 2023-04-05 NOTE — Assessment & Plan Note (Addendum)
Clinically stable.  No red flag signs or symptoms. Benign physical examination. Abnormal EKG. Has several risk factors for CAD Recommend cardiology evaluation. Referral placed today. ED precautions given

## 2023-04-05 NOTE — Assessment & Plan Note (Signed)
Diet and nutrition discussed Lipid profile done today Continue atorvastatin 10 mg daily The 10-year ASCVD risk score (Arnett DK, et al., 2019) is: 12.8%   Values used to calculate the score:     Age: 72 years     Sex: Female     Is Non-Hispanic African American: No     Diabetic: No     Tobacco smoker: No     Systolic Blood Pressure: 122 mmHg     Is BP treated: No     HDL Cholesterol: 91.9 mg/dL     Total Cholesterol: 164 mg/dL

## 2023-04-05 NOTE — Assessment & Plan Note (Signed)
Recommend cardiology evaluation Referral placed today

## 2023-04-05 NOTE — Assessment & Plan Note (Signed)
Clinically stable.  No recent fractures. Continues Fosamax 70 mg weekly

## 2023-04-06 LAB — COMPREHENSIVE METABOLIC PANEL
ALT: 15 U/L (ref 0–35)
AST: 21 U/L (ref 0–37)
Albumin: 4.3 g/dL (ref 3.5–5.2)
Alkaline Phosphatase: 102 U/L (ref 39–117)
BUN: 14 mg/dL (ref 6–23)
CO2: 29 meq/L (ref 19–32)
Calcium: 9.6 mg/dL (ref 8.4–10.5)
Chloride: 105 meq/L (ref 96–112)
Creatinine, Ser: 0.7 mg/dL (ref 0.40–1.20)
GFR: 85.26 mL/min (ref >60.00)
Glucose, Bld: 109 mg/dL — ABNORMAL HIGH (ref 70–99)
Potassium: 4.1 meq/L (ref 3.5–5.1)
Sodium: 140 meq/L (ref 135–145)
Total Bilirubin: 0.4 mg/dL (ref 0.2–1.2)
Total Protein: 7.3 g/dL (ref 6.0–8.3)

## 2023-04-06 LAB — LIPID PANEL
Cholesterol: 179 mg/dL (ref 0–200)
HDL: 95.4 mg/dL (ref >39.00)
LDL Cholesterol: 54 mg/dL (ref 0–99)
NonHDL: 83.18
Total CHOL/HDL Ratio: 2
Triglycerides: 144 mg/dL (ref 0.0–149.0)
VLDL: 28.8 mg/dL (ref 0.0–40.0)

## 2023-04-06 LAB — HEPATITIS C ANTIBODY: Hepatitis C Ab: NONREACTIVE

## 2023-04-06 NOTE — Progress Notes (Unsigned)
Cardiology Office Note:   Date:  04/07/2023  ID:  Sara Sara [name] [place], [place] 02/07/1951, MRN [id number] PCP:  Georgina Quint, MD  [facility] Providers Cardiologist:  Alverda Skeans, MD Referring MD: Georgina Quint, *  Chief Complaint/Reason for Referral: Abnormal EKG, chest pain ASSESSMENT:    1. Nonspecific chest pain   2. Abnormal EKG   3. CKD (chronic kidney disease) stage 2, GFR 60-89 ml/min   4. BMI 31.0-31.9,adult     PLAN:   In order of problems listed above: Chest pain: Has never had this the patient had an episode of atypical chest pain.  She exerts herself on a regular basis and exercises on a regular basis without any issues.  I do not think an ischemic evaluation at this time is warranted as her symptoms are so atypical.  Will obtain an echocardiogram to evaluate further. Abnormal EKG: See discussion above. CKD stage II: Monitor for now, watch blood pressure. Elevated BMI: Diet and exercise.            Dispo:  Return in about 6 months (around 10/06/2023).      Medication Adjustments/Labs and Tests Ordered: Current medicines are reviewed at length with the patient today.  Concerns regarding medicines are outlined above.  The following changes have been made:  no change   Labs/tests ordered: Orders Placed This Encounter  Procedures   EKG 12-Lead   ECHOCARDIOGRAM COMPLETE    Medication Changes: No orders of the defined types were placed in this encounter.   Current medicines are reviewed at length with the patient today.  The patient does not have concerns regarding medicines.  History of Present Illness:      FOCUSED PROBLEM LIST:   BMI 31 Hyperlipidemia CKD stage II  October 2024: Patient is a 72 year old Sara [name] with medical problems referred for conditions regarding abnormal EKG and chest discomfort.  Apparently the patient describes a left-sided chest pressure episode that happened last month lasting 5 minutes radiate down her left  arm.  It started at work.  She also has had brief episodes of left-sided chest pain more recently.  She tells me she was at her part-time job 1 day and she was sitting and she developed a left-sided chest discomfort which radiated down her left arm lasted for about 5 minutes.  It has not recurred.  The other day she woke up and had some very sharp fleeting chest pains which again have not recurred.  Interestingly the patient exercises and walks on a regular basis without exertional angina or dyspnea.  She will get short of breath when she is on a treadmill and exercising to a high intensity.  She denies any palpitations, paroxysmal atrial dyspnea, orthopnea.  She has had no presyncope or syncope.  She does not smoke and she drinks occasionally.  She and her husband moved to [place] from [place] about 3 years ago.  He is retired.  She tells me she has been somewhat stressed over the last few years because she had to move away from her family.  This seems to be getting better however.        Current Medications: Current Meds  Medication Sig   alendronate (FOSAMAX) 70 MG tablet Take 1 tablet (70 mg total) by mouth every 7 (seven) days. Take with a full glass of water on an empty stomach.   atorvastatin (LIPITOR) 10 MG tablet Take 1 tablet (10 mg total) by mouth daily.   CALCIUM PO Take  by mouth daily.   DULoxetine (CYMBALTA) 30 MG capsule Take 1 capsule (30 mg total) by mouth daily.   latanoprost (XALATAN) 0.005 % ophthalmic solution Place 1 drop into both eyes at bedtime.   Omega-3 Fatty Acids (FISH OIL PO) Take by mouth daily.   timolol (TIMOPTIC) 0.25 % ophthalmic solution 1 drop every morning.     Review of Systems:   Please see the history of present illness.    All other systems reviewed and are negative.     EKGs/Labs/Other Test Reviewed:   EKG: EKG performed February 2023 that I reviewed demonstrates sinus rhythm with left anterior block and possible right ventricular  hypertrophy  EKG Interpretation Date/Time:  Wednesday April 07 2023 13:54:34 EDT Ventricular Rate:  75 PR Interval:  116 QRS Duration:  88 QT Interval:  392 QTC Calculation: 437 R Axis:   -13  Text Interpretation: Normal sinus rhythm Nonspecific T wave abnormality When compared with ECG of 13-Aug-2021 16:57, PREVIOUS ECG IS PRESENT Confirmed by Alverda Skeans ([id number]) on 04/07/2023 2:01:45 PM         Risk Assessment/Calculations:          Physical Exam:   VS:  BP 128/70   Pulse 61   Ht 5' 1.5" (1.562 m)   Wt 155 lb 12.8 oz (70.7 kg)   SpO2 94%   BMI 28.96 kg/m        Wt Readings from Last 3 Encounters:  04/07/23 155 lb 12.8 oz (70.7 kg)  04/05/23 155 lb 2 oz (70.4 kg)  12/01/22 155 lb 9.6 oz (70.6 kg)      GENERAL:  No apparent distress, AOx3 HEENT:  No carotid bruits, +2 carotid impulses, no scleral icterus CAR: RRR no murmurs, gallops, rubs, or thrills RES:  Clear to auscultation bilaterally ABD:  Soft, nontender, nondistended, positive bowel sounds x 4 VASC:  +2 radial pulses, +2 carotid pulses NEURO:  CN 2-12 grossly intact; motor and sensory grossly intact PSYCH:  No active depression or anxiety EXT:  No edema, ecchymosis, or cyanosis  Signed, Orbie Pyo, MD  04/07/2023 2:15 PM    [facility] [address] [place], [place], [place]  [address] Phone: [phone]; Fax: [phone]   Note:  This document was prepared using Dragon voice recognition software and may include unintentional dictation errors.

## 2023-04-07 ENCOUNTER — Ambulatory Visit: Payer: Medicare Other | Attending: Internal Medicine | Admitting: Internal Medicine

## 2023-04-07 ENCOUNTER — Encounter: Payer: Self-pay | Admitting: Internal Medicine

## 2023-04-07 VITALS — BP 128/70 | HR 61 | Ht 61.5 in | Wt 155.8 lb

## 2023-04-07 DIAGNOSIS — R079 Chest pain, unspecified: Secondary | ICD-10-CM | POA: Diagnosis not present

## 2023-04-07 DIAGNOSIS — R9431 Abnormal electrocardiogram [ECG] [EKG]: Secondary | ICD-10-CM | POA: Diagnosis not present

## 2023-04-07 DIAGNOSIS — N182 Chronic kidney disease, stage 2 (mild): Secondary | ICD-10-CM

## 2023-04-07 DIAGNOSIS — Z6831 Body mass index (BMI) 31.0-31.9, adult: Secondary | ICD-10-CM | POA: Diagnosis not present

## 2023-04-07 NOTE — Patient Instructions (Signed)
Medication Instructions:  No changes *If you need a refill on your cardiac medications before your next appointment, please call your pharmacy*   Lab Work: none   Testing/Procedures: Your physician has requested that you have an echocardiogram. Echocardiography is a painless test that uses sound waves to create images of your heart. It provides your doctor with information about the size and shape of your heart and how well your heart's chambers and valves are working. This procedure takes approximately one hour. There are no restrictions for this procedure. Please do NOT wear cologne, perfume, aftershave, or lotions (deodorant is allowed). Please arrive 15 minutes prior to your appointment time.   Follow-Up: At [facility], you and your health needs are our priority.  As part of our continuing mission to provide you with exceptional heart care, we have created designated Provider Care Teams.  These Care Teams include your primary Cardiologist (physician) and Advanced Practice Providers (APPs -  Physician Assistants and Nurse Practitioners) who all work together to provide you with the care you need, when you need it.   Your next appointment:   6 month(s)  Provider:   Jari Favre, PA-C, Ronie Spies, PA-C, Robin Searing, NP, Jacolyn Reedy, PA-C, Eligha Bridegroom, NP, Tereso Newcomer, PA-C, or Perlie Gold, PA-C

## 2023-04-13 DIAGNOSIS — Z1231 Encounter for screening mammogram for malignant neoplasm of breast: Secondary | ICD-10-CM | POA: Diagnosis not present

## 2023-04-19 ENCOUNTER — Other Ambulatory Visit: Payer: Self-pay | Admitting: Emergency Medicine

## 2023-04-19 DIAGNOSIS — M810 Age-related osteoporosis without current pathological fracture: Secondary | ICD-10-CM

## 2023-04-23 ENCOUNTER — Ambulatory Visit (HOSPITAL_COMMUNITY): Payer: Medicare Other | Attending: Internal Medicine

## 2023-04-23 DIAGNOSIS — R079 Chest pain, unspecified: Secondary | ICD-10-CM | POA: Insufficient documentation

## 2023-04-23 DIAGNOSIS — R9431 Abnormal electrocardiogram [ECG] [EKG]: Secondary | ICD-10-CM | POA: Insufficient documentation

## 2023-04-23 LAB — ECHOCARDIOGRAM COMPLETE
Area-P 1/2: 3.55 cm2
P 1/2 time: 596 ms
S' Lateral: 2.2 cm

## 2023-06-16 ENCOUNTER — Telehealth: Payer: Self-pay | Admitting: Emergency Medicine

## 2023-06-16 ENCOUNTER — Ambulatory Visit (INDEPENDENT_AMBULATORY_CARE_PROVIDER_SITE_OTHER): Payer: Medicare Other | Admitting: Nurse Practitioner

## 2023-06-16 VITALS — BP 138/80 | HR 74 | Temp 98.5°F | Ht 61.5 in | Wt 161.0 lb

## 2023-06-16 DIAGNOSIS — J029 Acute pharyngitis, unspecified: Secondary | ICD-10-CM | POA: Insufficient documentation

## 2023-06-16 LAB — POCT RAPID STREP A (OFFICE): Rapid Strep A Screen: NEGATIVE

## 2023-06-16 MED ORDER — AMOXICILLIN-POT CLAVULANATE 875-125 MG PO TABS: 1.0000 | ORAL_TABLET | Freq: Two times a day (BID) | ORAL | 0 refills | Status: AC

## 2023-06-16 MED ORDER — BENZONATATE 200 MG PO CAPS: 200.0000 mg | ORAL_CAPSULE | Freq: Two times a day (BID) | ORAL | 1 refills | Status: AC | PRN

## 2023-06-16 NOTE — Progress Notes (Signed)
Acute Office Visit  Subjective:     Patient ID: Sara [name], female    DOB: [birth year], 72 y.o.   MRN: [id number]  Chief Complaint  Patient presents with   Sore Throat    HPI Patient is in today for acute visit for sore throat and cough.  Symptoms started 2 weeks ago.  Reports cough is now productive.  No shortness of breath or wheezing.  No fever.  Has been using DayQuil and NyQuil at home with some improvement in symptoms that is temporary.  Review of Systems  Constitutional:  Negative for fever.  HENT:  Positive for sore throat.   Respiratory:  Positive for cough and sputum production. Negative for shortness of breath and wheezing.   Cardiovascular:  Positive for chest pain (with deep breathing).  Neurological:  Positive for headaches.        Objective:    BP 138/80   Pulse 74   Temp 98.5 F (36.9 C) (Temporal)   Ht 5' 1.5" (1.562 m)   Wt 161 lb (73 kg)   SpO2 96%   BMI 29.93 kg/m    Physical Exam Vitals reviewed.  Constitutional:      General: She is not in acute distress.    Appearance: Normal appearance.  HENT:     Head: Normocephalic and atraumatic.  Neck:     Vascular: No carotid bruit.  Cardiovascular:     Rate and Rhythm: Normal rate and regular rhythm.     Pulses: Normal pulses.     Heart sounds: Normal heart sounds.  Pulmonary:     Effort: Pulmonary effort is normal.     Breath sounds: Normal breath sounds.  Skin:    General: Skin is warm and dry.  Neurological:     General: No focal deficit present.     Mental Status: She is alert and oriented to person, place, and time.  Psychiatric:        Mood and Affect: Mood normal.        Behavior: Behavior normal.        Judgment: Judgment normal.     No results found for any visits on 06/16/23.      Assessment & Plan:   Problem List Items Addressed This Visit       Respiratory   Pharyngitis - Primary   Acute POC strep test: negative Symptoms have been present >/= 14 days  with cough and without improvement. VSS today. Treat with 10-day course of Augmentin and benzonatate Perles as needed for cough suppression.  Patient encouraged to all office if symptoms do not improve.       Relevant Medications   amoxicillin-clavulanate (AUGMENTIN) 875-125 MG tablet   benzonatate (TESSALON) 200 MG capsule   Other Relevant Orders   POCT rapid strep A    Meds ordered this encounter  Medications   amoxicillin-clavulanate (AUGMENTIN) 875-125 MG tablet    Sig: Take 1 tablet by mouth 2 (two) times daily.    Dispense:  20 tablet    Refill:  0    Supervising Provider:   BURNS, Bobette Mo [[id number]]   benzonatate (TESSALON) 200 MG capsule    Sig: Take 1 capsule (200 mg total) by mouth 2 (two) times daily as needed for cough.    Dispense:  20 capsule    Refill:  1    Supervising Provider:   Pincus Sanes [id number]    Return if symptoms worsen or fail to improve.  [name]  Doristine Church, NP

## 2023-06-16 NOTE — Assessment & Plan Note (Addendum)
Acute POC strep test: negative Symptoms have been present >/= 14 days with cough and without improvement. VSS today. Treat with 10-day course of Augmentin and benzonatate Perles as needed for cough suppression.  Patient encouraged to all office if symptoms do not improve.

## 2023-06-16 NOTE — Telephone Encounter (Signed)
Patient states she will be moving and will no longer be seen at this practice. She appreciates the service given to her from Dr.Sagardia and everyone at the office.

## 2023-07-31 ENCOUNTER — Other Ambulatory Visit: Payer: Self-pay | Admitting: Medical

## 2024-04-04 ENCOUNTER — Encounter: Payer: Medicare Other | Admitting: Emergency Medicine
